# Patient Record
Sex: Male | Born: 1935 | Race: Black or African American | Hispanic: No | Marital: Single | Smoking: Former smoker
Health system: Southern US, Community
[De-identification: ages and names within clinical notes are randomized; demographics above are authoritative.]

---

## 2014-10-05 DIAGNOSIS — R000 Tachycardia, unspecified: Secondary | ICD-10-CM | POA: Diagnosis not present

## 2014-10-05 DIAGNOSIS — Z0000 Encounter for general adult medical examination without abnormal findings: Secondary | ICD-10-CM | POA: Diagnosis not present

## 2014-10-05 DIAGNOSIS — R030 Elevated blood-pressure reading, without diagnosis of hypertension: Secondary | ICD-10-CM | POA: Diagnosis not present

## 2014-10-05 DIAGNOSIS — R231 Pallor: Secondary | ICD-10-CM | POA: Diagnosis not present

## 2014-10-05 DIAGNOSIS — Z125 Encounter for screening for malignant neoplasm of prostate: Secondary | ICD-10-CM | POA: Diagnosis not present

## 2014-11-02 DIAGNOSIS — Z0000 Encounter for general adult medical examination without abnormal findings: Secondary | ICD-10-CM | POA: Diagnosis not present

## 2017-01-03 DIAGNOSIS — H60393 Other infective otitis externa, bilateral: Secondary | ICD-10-CM | POA: Diagnosis not present

## 2017-01-03 DIAGNOSIS — H6123 Impacted cerumen, bilateral: Secondary | ICD-10-CM | POA: Diagnosis not present

## 2017-01-03 DIAGNOSIS — H93293 Other abnormal auditory perceptions, bilateral: Secondary | ICD-10-CM | POA: Diagnosis not present

## 2020-04-02 ENCOUNTER — Other Ambulatory Visit: Payer: Self-pay

## 2020-04-02 ENCOUNTER — Emergency Department: Payer: Medicare Other

## 2020-04-02 DIAGNOSIS — I6782 Cerebral ischemia: Secondary | ICD-10-CM | POA: Diagnosis not present

## 2020-04-02 DIAGNOSIS — S0181XA Laceration without foreign body of other part of head, initial encounter: Secondary | ICD-10-CM | POA: Diagnosis not present

## 2020-04-02 DIAGNOSIS — Z87891 Personal history of nicotine dependence: Secondary | ICD-10-CM | POA: Diagnosis not present

## 2020-04-02 DIAGNOSIS — S0012XA Contusion of left eyelid and periocular area, initial encounter: Secondary | ICD-10-CM | POA: Diagnosis not present

## 2020-04-02 DIAGNOSIS — Y9389 Activity, other specified: Secondary | ICD-10-CM | POA: Diagnosis not present

## 2020-04-02 DIAGNOSIS — W01198A Fall on same level from slipping, tripping and stumbling with subsequent striking against other object, initial encounter: Secondary | ICD-10-CM | POA: Insufficient documentation

## 2020-04-02 DIAGNOSIS — Y999 Unspecified external cause status: Secondary | ICD-10-CM | POA: Insufficient documentation

## 2020-04-02 DIAGNOSIS — Y92009 Unspecified place in unspecified non-institutional (private) residence as the place of occurrence of the external cause: Secondary | ICD-10-CM | POA: Diagnosis not present

## 2020-04-02 DIAGNOSIS — S0993XA Unspecified injury of face, initial encounter: Secondary | ICD-10-CM | POA: Diagnosis present

## 2020-04-02 LAB — COMPREHENSIVE METABOLIC PANEL
ALT: 15 U/L (ref 0–44)
AST: 16 U/L (ref 15–41)
Albumin: 4 g/dL (ref 3.5–5.0)
Alkaline Phosphatase: 46 U/L (ref 38–126)
Anion gap: 11 (ref 5–15)
BUN: 42 mg/dL — ABNORMAL HIGH (ref 8–23)
CO2: 24 mmol/L (ref 22–32)
Calcium: 9.1 mg/dL (ref 8.9–10.3)
Chloride: 102 mmol/L (ref 98–111)
Creatinine, Ser: 2.21 mg/dL — ABNORMAL HIGH (ref 0.61–1.24)
GFR calc Af Amer: 29 mL/min — ABNORMAL LOW (ref >60)
GFR calc non Af Amer: 25 mL/min — ABNORMAL LOW (ref >60)
Glucose, Bld: 125 mg/dL — ABNORMAL HIGH (ref 70–99)
Potassium: 4.6 mmol/L (ref 3.5–5.1)
Sodium: 137 mmol/L (ref 135–145)
Total Bilirubin: 0.7 mg/dL (ref 0.3–1.2)
Total Protein: 8.4 g/dL — ABNORMAL HIGH (ref 6.5–8.1)

## 2020-04-02 LAB — CBC WITH DIFFERENTIAL/PLATELET
Abs Immature Granulocytes: 0.08 10*3/uL — ABNORMAL HIGH (ref 0.00–0.07)
Basophils Absolute: 0.1 10*3/uL (ref 0.0–0.1)
Basophils Relative: 0 %
Eosinophils Absolute: 0.2 10*3/uL (ref 0.0–0.5)
Eosinophils Relative: 2 %
HCT: 41.2 % (ref 39.0–52.0)
Hemoglobin: 13.2 g/dL (ref 13.0–17.0)
Immature Granulocytes: 1 %
Lymphocytes Relative: 9 %
Lymphs Abs: 1.2 10*3/uL (ref 0.7–4.0)
MCH: 30.1 pg (ref 26.0–34.0)
MCHC: 32 g/dL (ref 30.0–36.0)
MCV: 94.1 fL (ref 80.0–100.0)
Monocytes Absolute: 0.8 10*3/uL (ref 0.1–1.0)
Monocytes Relative: 6 %
Neutro Abs: 11.4 10*3/uL — ABNORMAL HIGH (ref 1.7–7.7)
Neutrophils Relative %: 82 %
Platelets: 231 10*3/uL (ref 150–400)
RBC: 4.38 MIL/uL (ref 4.22–5.81)
RDW: 12.8 % (ref 11.5–15.5)
WBC: 13.8 10*3/uL — ABNORMAL HIGH (ref 4.0–10.5)
nRBC: 0 % (ref 0.0–0.2)

## 2020-04-02 NOTE — ED Triage Notes (Signed)
Patient reports mechanical fall at home today. Patient HOH, daughter answering most questions for patient. Abrasion/swelling seen to left eye. Patient bilateral hand tremors, no hx of same.

## 2020-04-03 ENCOUNTER — Emergency Department
Admission: EM | Admit: 2020-04-03 | Discharge: 2020-04-03 | Disposition: A | Discharge status: Home / Self Care | Payer: Medicare Other | Attending: Emergency Medicine | Admitting: Emergency Medicine

## 2020-04-03 DIAGNOSIS — S0512XA Contusion of eyeball and orbital tissues, left eye, initial encounter: Secondary | ICD-10-CM

## 2020-04-03 DIAGNOSIS — S0181XA Laceration without foreign body of other part of head, initial encounter: Secondary | ICD-10-CM

## 2020-04-03 LAB — BASIC METABOLIC PANEL
Anion gap: 8 (ref 5–15)
BUN: 41 mg/dL — ABNORMAL HIGH (ref 8–23)
CO2: 26 mmol/L (ref 22–32)
Calcium: 8.3 mg/dL — ABNORMAL LOW (ref 8.9–10.3)
Chloride: 107 mmol/L (ref 98–111)
Creatinine, Ser: 1.97 mg/dL — ABNORMAL HIGH (ref 0.61–1.24)
GFR calc Af Amer: 33 mL/min — ABNORMAL LOW (ref >60)
GFR calc non Af Amer: 29 mL/min — ABNORMAL LOW (ref >60)
Glucose, Bld: 108 mg/dL — ABNORMAL HIGH (ref 70–99)
Potassium: 4.2 mmol/L (ref 3.5–5.1)
Sodium: 141 mmol/L (ref 135–145)

## 2020-04-03 LAB — URINALYSIS, COMPLETE (UACMP) WITH MICROSCOPIC
Bilirubin Urine: NEGATIVE
Glucose, UA: NEGATIVE mg/dL
Hgb urine dipstick: NEGATIVE
Ketones, ur: NEGATIVE mg/dL
Leukocytes,Ua: NEGATIVE
Nitrite: NEGATIVE
Protein, ur: NEGATIVE mg/dL
Specific Gravity, Urine: 1.013 (ref 1.005–1.030)
Squamous Epithelial / HPF: NONE SEEN (ref 0–5)
pH: 5 (ref 5.0–8.0)

## 2020-04-03 MED ORDER — SODIUM CHLORIDE 0.9 % IV BOLUS
1000.0000 mL | Freq: Once | INTRAVENOUS | Status: AC
  Administered 2020-04-03: 1000 mL via INTRAVENOUS

## 2020-04-03 NOTE — ED Provider Notes (Signed)
[facility] Emergency Department Provider Note  ____________________________________________   First MD Initiated Contact with Patient 04/03/20 0214     (approximate)  I have reviewed the triage vital signs and the nursing notes.   HISTORY  Chief Complaint Fall   HPI Austin Fleming is a 86 y.o. male with no known chronic medical conditions and no primary care provider presents to the emergency department following a mechanical fall at home today.  Patient's daughter states that patient stood up quickly resulting in him losing his balance striking the left side of his face.  Patient with a laceration lateral aspect superior to the left eye.  Patient denies any loss of consciousness.  Patient denies any preceding symptoms no chest pain shortness of breath.  Patient denies any preceding headache nausea or vomiting.  No weakness or numbness        History reviewed. No pertinent past medical history.  There are no problems to display for this patient.   History reviewed. No pertinent surgical history.  Prior to Admission medications   Not on File    Allergies Patient has no known allergies.  No family history on file.  Social History Social History   Tobacco Use  . Smoking status: Former Research scientist (life sciences)  . Smokeless tobacco: Former Network engineer Use Topics  . Alcohol use: Yes  . Drug use: Not on file    Review of Systems Constitutional: No fever/chills Eyes: No visual changes. ENT: No sore throat. Cardiovascular: Denies chest pain. Respiratory: Denies shortness of breath. Gastrointestinal: No abdominal pain.  No nausea, no vomiting.  No diarrhea.  No constipation. Genitourinary: Negative for dysuria. Musculoskeletal: Negative for neck pain.  Negative for back pain. Integumentary: Negative for rash.  Positive for facial laceration Neurological: Negative for headaches, focal weakness or  numbness.  ____________________________________________   PHYSICAL EXAM:  VITAL SIGNS: ED Triage Vitals  Enc Vitals Group     BP 04/02/20 2115 (!) 165/101     Pulse Rate 04/02/20 2115 88     Resp 04/02/20 2115 18     Temp 04/02/20 2115 98.4 F (36.9 C)     Temp src --      SpO2 04/02/20 2115 100 %     Weight 04/02/20 2116 72.6 kg (160 lb)     Height 04/02/20 2116 1.676 m (5\' 6" )     Head Circumference --      Peak Flow --      Pain Score --      Pain Loc --      Pain Edu? --      Excl. in [place]? --     Constitutional: Alert and oriented.  Eyes: Conjunctivae are normal.  Head: 1 cm linear laceration superior lateral aspect above the left eye.  Actively bleeding Mouth/Throat: Patient is wearing a mask. Neck: No stridor.  No meningeal signs.   Cardiovascular: Normal rate, regular rhythm. Good peripheral circulation. Grossly normal heart sounds. Respiratory: Normal respiratory effort.  No retractions. Gastrointestinal: Soft and nontender. No distention.  Musculoskeletal: No lower extremity tenderness nor edema. No gross deformities of extremities. Neurologic:  Normal speech and language. No gross focal neurologic deficits are appreciated.  Skin:  Skin is warm, dry and intact. Psychiatric: Mood and affect are normal. Speech and behavior are normal.  ____________________________________________   LABS (all labs ordered are listed, but only abnormal results are displayed)  Labs Reviewed  COMPREHENSIVE METABOLIC PANEL - Abnormal; Notable for the following components:  Result Value   Glucose, Bld 125 (*)    BUN 42 (*)    Creatinine, Ser 2.21 (*)    Total Protein 8.4 (*)    GFR calc non Af Amer 25 (*)    GFR calc Af Amer 29 (*)    All other components within normal limits  CBC WITH DIFFERENTIAL/PLATELET - Abnormal; Notable for the following components:   WBC 13.8 (*)    Neutro Abs 11.4 (*)    Abs Immature Granulocytes 0.08 (*)    All other components within normal  limits  URINALYSIS, COMPLETE (UACMP) WITH MICROSCOPIC  BASIC METABOLIC PANEL     RADIOLOGY I, [place] Danessa Mensch, personally viewed and evaluated these images (plain radiographs) as part of my medical decision making, as well as reviewing the written report by the radiologist.  ED MD interpretation: No acute findings noted on CT head and cervical spine per radiologist.  Official radiology report(s): CT Head Wo Contrast  Result Date: 04/02/2020 CLINICAL DATA:  Recent fall with facial trauma, initial encounter EXAM: CT HEAD WITHOUT CONTRAST CT MAXILLOFACIAL WITHOUT CONTRAST CT CERVICAL SPINE WITHOUT CONTRAST TECHNIQUE: Multidetector CT imaging of the head, cervical spine, and maxillofacial structures were performed using the standard protocol without intravenous contrast. Multiplanar CT image reconstructions of the cervical spine and maxillofacial structures were also generated. COMPARISON:  None. FINDINGS: CT HEAD FINDINGS Brain: Mild atrophic changes and chronic white matter ischemic changes are seen. No findings to suggest acute hemorrhage, acute infarction or space-occupying mass lesion are noted. Vascular: No hyperdense vessel or unexpected calcification. Skull: Normal. Negative for fracture or focal lesion. Other: Soft tissue swelling is noted in the left supraorbital region CT MAXILLOFACIAL FINDINGS Osseous: No acute fracture or dislocation is noted. There are periapical lucencies noted in mandible in the residual incisors as well as significant dental caries in the remaining teeth. No bony erosive changes are seen. Orbits: Orbits and their contents are within normal limits. Sinuses: Paranasal sinuses are well aerated. A few small mucosal retention cysts are seen in the maxillary antra on the right. Soft tissues: Considerable soft tissue swelling is noted along the left orbit laterally and superiorly consistent with the recent injury. Some subcutaneous air is noted likely related to underlying  laceration. No focal hematoma is seen. CT CERVICAL SPINE FINDINGS Alignment: Within normal limits. Skull base and vertebrae: 7 cervical segments are well visualized. Vertebral body height is well maintained. Disc space narrowing is noted from C4-C7 with associated osteophytic changes. Bilateral facet hypertrophic changes noted. No acute fracture or acute facet abnormality is seen. Soft tissues and spinal canal: Surrounding soft tissue structures show atherosclerotic calcifications of carotid arteries bilaterally. No acute soft tissue abnormality is seen. Upper chest: Visualized lung apices are within normal limits. Other: None IMPRESSION: CT of the head: Chronic atrophic and ischemic changes without acute abnormality. CT of the maxillofacial bones: Left periorbital soft tissue swelling although no bony abnormality is seen. Dental caries and periapical lucencies within the mandible. Periapical abscesses could not be excluded. CT of the cervical spine: Multilevel degenerative change without acute abnormality. Electronically Signed   By: Inez Catalina M.D.   On: 04/02/2020 21:56   CT Cervical Spine Wo Contrast  Result Date: 04/02/2020 CLINICAL DATA:  Recent fall with facial trauma, initial encounter EXAM: CT HEAD WITHOUT CONTRAST CT MAXILLOFACIAL WITHOUT CONTRAST CT CERVICAL SPINE WITHOUT CONTRAST TECHNIQUE: Multidetector CT imaging of the head, cervical spine, and maxillofacial structures were performed using the standard protocol without intravenous contrast. Multiplanar CT  image reconstructions of the cervical spine and maxillofacial structures were also generated. COMPARISON:  None. FINDINGS: CT HEAD FINDINGS Brain: Mild atrophic changes and chronic white matter ischemic changes are seen. No findings to suggest acute hemorrhage, acute infarction or space-occupying mass lesion are noted. Vascular: No hyperdense vessel or unexpected calcification. Skull: Normal. Negative for fracture or focal lesion. Other: Soft  tissue swelling is noted in the left supraorbital region CT MAXILLOFACIAL FINDINGS Osseous: No acute fracture or dislocation is noted. There are periapical lucencies noted in mandible in the residual incisors as well as significant dental caries in the remaining teeth. No bony erosive changes are seen. Orbits: Orbits and their contents are within normal limits. Sinuses: Paranasal sinuses are well aerated. A few small mucosal retention cysts are seen in the maxillary antra on the right. Soft tissues: Considerable soft tissue swelling is noted along the left orbit laterally and superiorly consistent with the recent injury. Some subcutaneous air is noted likely related to underlying laceration. No focal hematoma is seen. CT CERVICAL SPINE FINDINGS Alignment: Within normal limits. Skull base and vertebrae: 7 cervical segments are well visualized. Vertebral body height is well maintained. Disc space narrowing is noted from C4-C7 with associated osteophytic changes. Bilateral facet hypertrophic changes noted. No acute fracture or acute facet abnormality is seen. Soft tissues and spinal canal: Surrounding soft tissue structures show atherosclerotic calcifications of carotid arteries bilaterally. No acute soft tissue abnormality is seen. Upper chest: Visualized lung apices are within normal limits. Other: None IMPRESSION: CT of the head: Chronic atrophic and ischemic changes without acute abnormality. CT of the maxillofacial bones: Left periorbital soft tissue swelling although no bony abnormality is seen. Dental caries and periapical lucencies within the mandible. Periapical abscesses could not be excluded. CT of the cervical spine: Multilevel degenerative change without acute abnormality. Electronically Signed   By: Inez Catalina M.D.   On: 04/02/2020 21:56   CT Maxillofacial Wo Contrast  Result Date: 04/02/2020 CLINICAL DATA:  Recent fall with facial trauma, initial encounter EXAM: CT HEAD WITHOUT CONTRAST CT  MAXILLOFACIAL WITHOUT CONTRAST CT CERVICAL SPINE WITHOUT CONTRAST TECHNIQUE: Multidetector CT imaging of the head, cervical spine, and maxillofacial structures were performed using the standard protocol without intravenous contrast. Multiplanar CT image reconstructions of the cervical spine and maxillofacial structures were also generated. COMPARISON:  None. FINDINGS: CT HEAD FINDINGS Brain: Mild atrophic changes and chronic white matter ischemic changes are seen. No findings to suggest acute hemorrhage, acute infarction or space-occupying mass lesion are noted. Vascular: No hyperdense vessel or unexpected calcification. Skull: Normal. Negative for fracture or focal lesion. Other: Soft tissue swelling is noted in the left supraorbital region CT MAXILLOFACIAL FINDINGS Osseous: No acute fracture or dislocation is noted. There are periapical lucencies noted in mandible in the residual incisors as well as significant dental caries in the remaining teeth. No bony erosive changes are seen. Orbits: Orbits and their contents are within normal limits. Sinuses: Paranasal sinuses are well aerated. A few small mucosal retention cysts are seen in the maxillary antra on the right. Soft tissues: Considerable soft tissue swelling is noted along the left orbit laterally and superiorly consistent with the recent injury. Some subcutaneous air is noted likely related to underlying laceration. No focal hematoma is seen. CT CERVICAL SPINE FINDINGS Alignment: Within normal limits. Skull base and vertebrae: 7 cervical segments are well visualized. Vertebral body height is well maintained. Disc space narrowing is noted from C4-C7 with associated osteophytic changes. Bilateral facet hypertrophic changes noted. No acute  fracture or acute facet abnormality is seen. Soft tissues and spinal canal: Surrounding soft tissue structures show atherosclerotic calcifications of carotid arteries bilaterally. No acute soft tissue abnormality is seen.  Upper chest: Visualized lung apices are within normal limits. Other: None IMPRESSION: CT of the head: Chronic atrophic and ischemic changes without acute abnormality. CT of the maxillofacial bones: Left periorbital soft tissue swelling although no bony abnormality is seen. Dental caries and periapical lucencies within the mandible. Periapical abscesses could not be excluded. CT of the cervical spine: Multilevel degenerative change without acute abnormality. Electronically Signed   By: Inez Catalina M.D.   On: 04/02/2020 21:56     Procedures   ____________________________________________   INITIAL IMPRESSION / MDM / ASSESSMENT AND PLAN / ED COURSE  As part of my medical decision making, I reviewed the following data within the [place] NUMBER  86 year old male presented with above-stated history and physical exam differential diagnosis including but not limited to facial laceration periorbital contusion, intracranial hemorrhage facial bone fracture cervical injury.  CT head maxillofacial and cervical spine revealed no acute findings.  Laboratory data notable for creatinine of 2.21 with a BUN of 42.  Patient given 1 L IV normal saline with improvement of creatinine to 1.97 BUN of 41.  Patient receiving an additional liter of fluid now.  Patient's daughter states that he does poorly drinking water.  I spoke with the patient's daughter regarding the need to follow-up with primary care provider if to which should be referred.  She states that her father only goes to the doctor when he has a problem and does not have a primary care provider.  ____________________________________________  FINAL CLINICAL IMPRESSION(S) / ED DIAGNOSES  Final diagnoses:  Periorbital contusion of left eye, initial encounter  Facial laceration, initial encounter     MEDICATIONS GIVEN DURING THIS VISIT:  Medications  sodium chloride 0.9 % bolus 1,000 mL (0 mLs Intravenous Stopped 04/03/20 0333)  sodium  chloride 0.9 % bolus 1,000 mL (1,000 mLs Intravenous New Bag/Given 04/03/20 0347)     ED Discharge Orders    None      *Please note:  Austin Fleming was evaluated in Emergency Department on 04/03/2020 for the symptoms described in the history of present illness. He was evaluated in the context of the global COVID-19 pandemic, which necessitated consideration that the patient might be at risk for infection with the SARS-CoV-2 virus that causes COVID-19. Institutional protocols and algorithms that pertain to the evaluation of patients at risk for COVID-19 are in a state of rapid change based on information released by regulatory bodies including the CDC and federal and state organizations. These policies and algorithms were followed during the patient's care in the ED.  Some ED evaluations and interventions may be delayed as a result of limited staffing during and after the pandemic.*  Note:  This document was prepared using Dragon voice recognition software and may include unintentional dictation errors.   Gregor Hams, MD 04/03/20 [phone]

## 2020-04-03 NOTE — ED Notes (Signed)
Reviewed discharge instructions, follow-up care, and laceration care with patient. Patient verbalized understanding of all information reviewed. Patient stable, with no distress noted at this time.

## 2021-02-19 ENCOUNTER — Emergency Department: Payer: Medicare Other

## 2021-02-19 ENCOUNTER — Inpatient Hospital Stay
Admission: EM | Admit: 2021-02-19 | Discharge: 2021-02-23 | DRG: 065 | Disposition: A | Discharge status: Skilled Nursing Facility | Payer: Medicare Other | Attending: Internal Medicine | Admitting: Internal Medicine

## 2021-02-19 ENCOUNTER — Other Ambulatory Visit: Payer: Self-pay

## 2021-02-19 ENCOUNTER — Observation Stay: Payer: Medicare Other

## 2021-02-19 DIAGNOSIS — R55 Syncope and collapse: Secondary | ICD-10-CM | POA: Diagnosis not present

## 2021-02-19 DIAGNOSIS — I441 Atrioventricular block, second degree: Secondary | ICD-10-CM | POA: Diagnosis present

## 2021-02-19 DIAGNOSIS — Z20822 Contact with and (suspected) exposure to covid-19: Secondary | ICD-10-CM | POA: Diagnosis present

## 2021-02-19 DIAGNOSIS — H9190 Unspecified hearing loss, unspecified ear: Secondary | ICD-10-CM | POA: Diagnosis present

## 2021-02-19 DIAGNOSIS — Z7982 Long term (current) use of aspirin: Secondary | ICD-10-CM

## 2021-02-19 DIAGNOSIS — R4182 Altered mental status, unspecified: Secondary | ICD-10-CM | POA: Diagnosis present

## 2021-02-19 DIAGNOSIS — I639 Cerebral infarction, unspecified: Secondary | ICD-10-CM

## 2021-02-19 DIAGNOSIS — R54 Age-related physical debility: Secondary | ICD-10-CM | POA: Diagnosis present

## 2021-02-19 DIAGNOSIS — E860 Dehydration: Secondary | ICD-10-CM | POA: Diagnosis not present

## 2021-02-19 DIAGNOSIS — Z79899 Other long term (current) drug therapy: Secondary | ICD-10-CM

## 2021-02-19 DIAGNOSIS — N184 Chronic kidney disease, stage 4 (severe): Secondary | ICD-10-CM | POA: Diagnosis present

## 2021-02-19 DIAGNOSIS — R29702 NIHSS score 2: Secondary | ICD-10-CM | POA: Diagnosis present

## 2021-02-19 DIAGNOSIS — E785 Hyperlipidemia, unspecified: Secondary | ICD-10-CM | POA: Diagnosis present

## 2021-02-19 DIAGNOSIS — Z87891 Personal history of nicotine dependence: Secondary | ICD-10-CM

## 2021-02-19 DIAGNOSIS — I6322 Cerebral infarction due to unspecified occlusion or stenosis of basilar arteries: Principal | ICD-10-CM | POA: Diagnosis present

## 2021-02-19 DIAGNOSIS — R410 Disorientation, unspecified: Secondary | ICD-10-CM

## 2021-02-19 DIAGNOSIS — R4189 Other symptoms and signs involving cognitive functions and awareness: Secondary | ICD-10-CM | POA: Diagnosis present

## 2021-02-19 DIAGNOSIS — F419 Anxiety disorder, unspecified: Secondary | ICD-10-CM | POA: Diagnosis present

## 2021-02-19 LAB — BASIC METABOLIC PANEL
Anion gap: 7 (ref 5–15)
BUN: 51 mg/dL — ABNORMAL HIGH (ref 8–23)
CO2: 22 mmol/L (ref 22–32)
Calcium: 8.9 mg/dL (ref 8.9–10.3)
Chloride: 109 mmol/L (ref 98–111)
Creatinine, Ser: 2.28 mg/dL — ABNORMAL HIGH (ref 0.61–1.24)
GFR, Estimated: 26 mL/min — ABNORMAL LOW (ref >60)
Glucose, Bld: 130 mg/dL — ABNORMAL HIGH (ref 70–99)
Potassium: 4 mmol/L (ref 3.5–5.1)
Sodium: 138 mmol/L (ref 135–145)

## 2021-02-19 LAB — CBC
HCT: 39 % (ref 39.0–52.0)
Hemoglobin: 12.7 g/dL — ABNORMAL LOW (ref 13.0–17.0)
MCH: 29.9 pg (ref 26.0–34.0)
MCHC: 32.6 g/dL (ref 30.0–36.0)
MCV: 91.8 fL (ref 80.0–100.0)
Platelets: 247 10*3/uL (ref 150–400)
RBC: 4.25 MIL/uL (ref 4.22–5.81)
RDW: 12.9 % (ref 11.5–15.5)
WBC: 11.2 10*3/uL — ABNORMAL HIGH (ref 4.0–10.5)
nRBC: 0 % (ref 0.0–0.2)

## 2021-02-19 LAB — URINALYSIS, COMPLETE (UACMP) WITH MICROSCOPIC
Bilirubin Urine: NEGATIVE
Glucose, UA: NEGATIVE mg/dL
Ketones, ur: NEGATIVE mg/dL
Leukocytes,Ua: NEGATIVE
Nitrite: NEGATIVE
Protein, ur: NEGATIVE mg/dL
Specific Gravity, Urine: 1.013 (ref 1.005–1.030)
Squamous Epithelial / HPF: NONE SEEN (ref 0–5)
pH: 5 (ref 5.0–8.0)

## 2021-02-19 LAB — RESP PANEL BY RT-PCR (FLU A&B, COVID) ARPGX2
Influenza A by PCR: NEGATIVE
Influenza B by PCR: NEGATIVE
SARS Coronavirus 2 by RT PCR: NEGATIVE

## 2021-02-19 LAB — GLUCOSE, CAPILLARY: Glucose-Capillary: 108 mg/dL — ABNORMAL HIGH (ref 70–99)

## 2021-02-19 LAB — TROPONIN I (HIGH SENSITIVITY)
Troponin I (High Sensitivity): 12 ng/L (ref <18)
Troponin I (High Sensitivity): 12 ng/L (ref <18)

## 2021-02-19 MED ORDER — SODIUM CHLORIDE 0.9% FLUSH
3.0000 mL | Freq: Two times a day (BID) | INTRAVENOUS | Status: DC
  Administered 2021-02-19 – 2021-02-23 (×9): 3 mL via INTRAVENOUS

## 2021-02-19 MED ORDER — STROKE: EARLY STAGES OF RECOVERY BOOK: Freq: Once | Status: DC

## 2021-02-19 MED ORDER — LACTATED RINGERS IV BOLUS
1000.0000 mL | Freq: Once | INTRAVENOUS | Status: AC
  Administered 2021-02-19: 10:00:00 1000 mL via INTRAVENOUS

## 2021-02-19 MED ORDER — ENOXAPARIN SODIUM 30 MG/0.3ML IJ SOSY: 30.0000 mg | PREFILLED_SYRINGE | INTRAMUSCULAR | Status: DC

## 2021-02-19 MED ORDER — ONDANSETRON HCL 4 MG/2ML IJ SOLN
4.0000 mg | Freq: Four times a day (QID) | INTRAMUSCULAR | Status: DC | PRN
  Filled 2021-02-19: qty 2

## 2021-02-19 MED ORDER — ONDANSETRON HCL 4 MG PO TABS: 4.0000 mg | ORAL_TABLET | Freq: Four times a day (QID) | ORAL | Status: DC | PRN

## 2021-02-19 MED ORDER — ORAL CARE MOUTH RINSE
15.0000 mL | Freq: Two times a day (BID) | OROMUCOSAL | Status: DC
  Administered 2021-02-19 – 2021-02-23 (×8): 15 mL via OROMUCOSAL
  Filled 2021-02-19 (×2): qty 15

## 2021-02-19 MED ORDER — SODIUM CHLORIDE 0.9 % IV SOLN: INTRAVENOUS | Status: DC

## 2021-02-19 MED ORDER — POLYETHYLENE GLYCOL 3350 17 G PO PACK
17.0000 g | PACK | Freq: Once | ORAL | Status: AC
  Administered 2021-02-19: 17 g via ORAL
  Filled 2021-02-19: qty 1

## 2021-02-19 MED ORDER — VITAMIN D3 25 MCG (1000 UNIT) PO TABS
1000.0000 [IU] | ORAL_TABLET | Freq: Every day | ORAL | Status: DC
  Administered 2021-02-20 – 2021-02-23 (×4): 1000 [IU] via ORAL
  Filled 2021-02-19 (×7): qty 1

## 2021-02-19 MED ORDER — ASPIRIN 325 MG PO TABS
325.0000 mg | ORAL_TABLET | Freq: Every day | ORAL | Status: DC
  Administered 2021-02-19: 325 mg via ORAL
  Filled 2021-02-19: qty 1

## 2021-02-19 MED ORDER — ACETAMINOPHEN 500 MG PO TABS: 500.0000 mg | ORAL_TABLET | Freq: Four times a day (QID) | ORAL | Status: DC | PRN

## 2021-02-19 MED ORDER — VITAMIN B-12 1000 MCG PO TABS
1000.0000 ug | ORAL_TABLET | Freq: Every day | ORAL | Status: DC
  Administered 2021-02-20 – 2021-02-22 (×3): 1000 ug via ORAL
  Filled 2021-02-19 (×3): qty 1

## 2021-02-19 MED ORDER — ATORVASTATIN CALCIUM 20 MG PO TABS
80.0000 mg | ORAL_TABLET | Freq: Every day | ORAL | Status: DC
  Administered 2021-02-19 – 2021-02-20 (×2): 80 mg via ORAL
  Filled 2021-02-19 (×2): qty 4

## 2021-02-19 NOTE — Progress Notes (Addendum)
Patient admitted to the hospital after he fell at home for ??  Syncopal episode to rule out an acute stroke. 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  Additional punctate infarct in the high right frontoparietal perirolandic white matter.  Mild associated edema without mass-effect.  Given involvement of different vascular territories, consider embolic etiology. 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 Patient is outside the window for TPA I initially ordered aspirin 325 and high intensity statins but discussed with neurology who recommends to hold aspirin for now due to the findings of supratentorial microhemorrhages. Called and discussed MRI findings with patient's daughter and HPOA, Alvie Heidelberg. All questions and concerns have been addressed.  She verbalizes understanding and agrees with the plan.

## 2021-02-19 NOTE — ED Provider Notes (Signed)
[facility] Emergency Department Provider Note  ____________________________________________   Event Date/Time   First MD Initiated Contact with Patient 02/19/21 [id number]     (approximate)  I have reviewed the triage vital signs and the nursing notes.   HISTORY  Chief Complaint Loss of Consciousness and Altered Mental Status    HPI Austin Fleming is a 87 y.o. male here with increasing confusion.  Patient lives with family.  He reportedly was found at the bottom of the foot of his bed, and was very confused.  Patient does state that he feels somewhat weak but denies any other complaints.  Specifically, denies any headache, arm pain, hip pain after the fall.  Per family, patient has not been eating and drinking much, and was actually brought to his physician last week for weakness, and told to drink more fluid.  He has essentially not had anything to eat or drink for the last several days.  He said no nausea or vomiting.  On my assessment, patient denies any complaints but history is limited due to confusion.  Level 5 caveat invoked as remainder of history, ROS, and physical exam limited due to patient's confusion.     History reviewed. No pertinent past medical history.  There are no problems to display for this patient.   History reviewed. No pertinent surgical history.  Prior to Admission medications   Medication Sig Start Date End Date Taking? Authorizing Provider  acetaminophen (TYLENOL) 500 MG tablet Take 500 mg by mouth every 6 (six) hours as needed.   Yes [provider]  aspirin 325 MG tablet Take 325 mg by mouth daily.   Yes [provider]  Cholecalciferol 25 MCG (1000 UT) capsule Take 1,000 Units by mouth daily.   Yes [provider]  cyanocobalamin 1000 MCG tablet Take 1,000 mcg by mouth daily.   Yes [provider]  Misc Natural Products (OSTEO BI-FLEX ADV TRIPLE ST PO) Take 1 tablet by mouth daily.   Yes  [provider]    Allergies Patient has no known allergies.  No family history on file.  Social History Social History   Tobacco Use   Smoking status: Former    Pack years: 0.00   Smokeless tobacco: Former  Substance Use Topics   Alcohol use: Yes    Review of Systems  Review of Systems  Constitutional:  Positive for fatigue. Negative for chills and fever.  HENT:  Negative for sore throat.   Respiratory:  Negative for shortness of breath.   Cardiovascular:  Negative for chest pain.  Gastrointestinal:  Negative for abdominal pain.  Genitourinary:  Negative for flank pain.  Musculoskeletal:  Negative for neck pain.  Skin:  Negative for rash and wound.  Allergic/Immunologic: Negative for immunocompromised state.  Neurological:  Positive for weakness. Negative for numbness.  Hematological:  Does not bruise/bleed easily.  Psychiatric/Behavioral:  Positive for confusion.   All other systems reviewed and are negative.   ____________________________________________  PHYSICAL EXAM:      VITAL SIGNS: ED Triage Vitals  Enc Vitals Group     BP 02/19/21 0854 (!) 160/99     Pulse Rate 02/19/21 0854 83     Resp 02/19/21 0854 19     Temp 02/19/21 0854 97.9 F (36.6 C)     Temp Source 02/19/21 0854 Oral     SpO2 02/19/21 0854 99 %     Weight 02/19/21 0843 151 lb 11.2 oz (68.8 kg)  Height 02/19/21 0843 '5\' 6"'$  (1.676 m)     Head Circumference --      Peak Flow --      Pain Score 02/19/21 0843 0     Pain Loc --      Pain Edu? --      Excl. in [place]? --      Physical Exam Vitals and nursing note reviewed.  Constitutional:      General: He is not in acute distress.    Appearance: He is well-developed.  HENT:     Head: Normocephalic and atraumatic.     Mouth/Throat:     Mouth: Mucous membranes are dry.  Eyes:     Conjunctiva/sclera: Conjunctivae normal.  Cardiovascular:     Rate and Rhythm: Normal rate and regular rhythm.     Heart sounds: Normal heart  sounds.  Pulmonary:     Effort: Pulmonary effort is normal. No respiratory distress.     Breath sounds: No wheezing.  Abdominal:     General: There is no distension.  Musculoskeletal:     Cervical back: Neck supple.  Skin:    General: Skin is warm.     Capillary Refill: Capillary refill takes less than 2 seconds.     Findings: No rash.  Neurological:     Mental Status: He is alert.     Motor: No abnormal muscle tone.     Comments: Oriented to person, knows he is in [place] but not the city.  He is able to state he is in a hospital.  Unsure of the date or year.  Moves all extremities with 5 and 5 strength.  Normal sensation to light touch.  Cranial nerves grossly intact.      ____________________________________________   LABS (all labs ordered are listed, but only abnormal results are displayed)  Labs Reviewed  BASIC METABOLIC PANEL - Abnormal; Notable for the following components:      Result Value   Glucose, Bld 130 (*)    BUN 51 (*)    Creatinine, Ser 2.28 (*)    GFR, Estimated 26 (*)    All other components within normal limits  CBC - Abnormal; Notable for the following components:   WBC 11.2 (*)    Hemoglobin 12.7 (*)    All other components within normal limits  URINALYSIS, COMPLETE (UACMP) WITH MICROSCOPIC - Abnormal; Notable for the following components:   Color, Urine YELLOW (*)    APPearance CLEAR (*)    Hgb urine dipstick SMALL (*)    Bacteria, UA RARE (*)    All other components within normal limits  RESP PANEL BY RT-PCR (FLU A&B, COVID) [id number]  CBG MONITORING, ED  TROPONIN I (HIGH SENSITIVITY)  TROPONIN I (HIGH SENSITIVITY)    ____________________________________________  EKG: Normal sinus rhythm, ventricular rate 89.  PR 265, QRS 95, QTc 446.  LVH with secondary repull.  No acute ST elevations or depressions. ________________________________________  RADIOLOGY All imaging, including plain films, CT scans, and ultrasounds, independently  reviewed by me, and interpretations confirmed via formal radiology reads.  ED MD interpretation:   Chest x-ray: No active disease CT head: No acute intracranial abnormality  Official radiology report(s): DG Chest 2 View  Result Date: 02/19/2021 CLINICAL DATA:  Weakness and confusion EXAM: CHEST - 2 VIEW COMPARISON:  None. FINDINGS: Normal heart size. Aortic tortuosity. There is no edema, consolidation, effusion, or pneumothorax. Artifact from EKG leads IMPRESSION: No evidence of active disease. Electronically Signed   By: Angelica Chessman  Watts M.D.   On: 02/19/2021 09:24   CT HEAD WO CONTRAST  Result Date: 02/19/2021 CLINICAL DATA:  Head trauma, minor. EXAM: CT HEAD WITHOUT CONTRAST TECHNIQUE: Contiguous axial images were obtained from the base of the skull through the vertex without intravenous contrast. COMPARISON:  CT head/maxillofacial 03/03/2020. FINDINGS: Brain: Mild-to-moderate cerebral atrophy. Advanced patchy and ill-defined hypoattenuation within the cerebral white matter, nonspecific but compatible with chronic small vessel ischemic disease. There is no acute intracranial hemorrhage. No demarcated cortical infarct. No extra-axial fluid collection. No evidence of intracranial mass. No midline shift. Vascular: No hyperdense vessel.  Atherosclerotic calcifications. Skull: Normal. Negative for fracture or focal lesion. Sinuses/Orbits: Visualized orbits show no acute finding. Redemonstrated complete opacification of the right frontal sinus. Scattered partial opacification of the bilateral ethmoid air cells. Frothy secretions within the left sphenoid sinus. Mild mucosal thickening and mucous retention cysts within the partially imaged right maxillary sinus. IMPRESSION: No evidence of acute intracranial abnormality. Mild-to-moderate cerebral atrophy and advanced cerebral white matter chronic small vessel ischemic disease. Paranasal sinus disease, as described. Electronically Signed   By: Kellie Simmering DO    On: 02/19/2021 10:01    ____________________________________________  PROCEDURES   Procedure(s) performed (including Critical Care):  Procedures  ____________________________________________  INITIAL IMPRESSION / MDM / [facility] / ED COURSE  As part of my medical decision making, I reviewed the following data within the [facility] notes reviewed and incorporated, Old chart reviewed, Notes from prior ED visits, and [place] Controlled Substance Database       *GLADWIN HARDCASTLE was evaluated in Emergency Department on 02/19/2021 for the symptoms described in the history of present illness. He was evaluated in the context of the global COVID-19 pandemic, which necessitated consideration that the patient might be at risk for infection with the SARS-CoV-2 virus that causes COVID-19. Institutional protocols and algorithms that pertain to the evaluation of patients at risk for COVID-19 are in a state of rapid change based on information released by regulatory bodies including the CDC and federal and state organizations. These policies and algorithms were followed during the patient's care in the ED.  Some ED evaluations and interventions may be delayed as a result of limited staffing during the pandemic.*     Medical Decision Making: 87 year old male here with generalized weakness and confusion.  Suspect delirium in the setting of decreased p.o. intake and dehydration, with underlying likely chronic microvascular changes of the brain.  He has no apparent focal neurological deficits.  Lab work does show increasing BUN to creatinine compared to baseline, and he reportedly has not wanted to eat or drink for 2 days.  Otherwise, no apparent infection.  Chest x-ray reviewed and is clear.  CT head without acute changes.  Urinalysis without signs of UTI.  Patient remains consistently confused off of his baseline per family.  Will admit for IV fluid and rehydration, further work-up  as indicated.  ____________________________________________  FINAL CLINICAL IMPRESSION(S) / ED DIAGNOSES  Final diagnoses:  Dehydration  Delirium     MEDICATIONS GIVEN DURING THIS VISIT:  Medications  lactated ringers bolus 1,000 mL (1,000 mLs Intravenous New Bag/Given 02/19/21 1009)     ED Discharge Orders     None        Note:  This document was prepared using Dragon voice recognition software and may include unintentional dictation errors.   Duffy Bruce, MD 02/19/21 1122

## 2021-02-19 NOTE — Progress Notes (Signed)
Daughter set the password and was informed that no one will be given any information unless they provide the password

## 2021-02-19 NOTE — H&P (Signed)
History and Physical    Austin Fleming [id number] DOB: [birth year] DOA: 02/19/2021  PCP: Leonel Ramsay, MD   Patient coming from: Home  I have personally briefly reviewed patient's old medical records in [place]  Chief Complaint: Change in mental status Most of the history was obtained from patient's daughter who was at the bedside, patient does not remember what happened this morning.  HPI: Austin Fleming is a 87 y.o. male with no significant past medical history who presents to the emergency room via EMS for evaluation of possible syncopal episode. Patient's daughter states that her father lives with her brother who had seen the patient earlier that morning sitting at the edge of the bed.  He subsequently had a loud noise and went to check on the patient and found him on the floor.  She is unable to tell me if he lost consciousness and the patient does not remember what happened.  According to her brother he was very diaphoretic and complained of nausea.  Patient denies having any pain anywhere or any headache. Family states that he has had poor oral intake for the last couple of days but he denies having any nausea, no vomiting, no diarrhea, no fever or chills. When he arrived to ER he was noted to be confused and was repeating phrases.  At the time of my evaluation, he is awake, alert and oriented to person place and time which is his baseline. He denies having any chest pain, no shortness of breath, no palpitations, no diaphoresis, no urinary symptoms, no changes in his bowel habits, no focal deficits, no blurred vision. Labs show sodium 138, potassium 4.0, chloride 109, bicarb 22, glucose 130, BUN 51 compared to baseline of 41, creatinine 2.28 compared to baseline of 1.97, calcium 8.9, troponin 12, white count 11.2, hemoglobin 12.7, hematocrit 39, MCV 91.8, RDW 12.9, platelet count 247 Respiratory viral panel is negative Chest X-ray reviewed by me shows no evidence of  cardiopulmonary disease CT scan of the head without contrast shows no evidence of acute intracranial abnormality. Mild-to-moderate cerebral atrophy and advanced cerebral white matter chronic small vessel ischemic disease.Paranasal sinus disease, as described. Twelve-lead EKG reviewed by me shows sinus rhythm with sinus pauses  ED Course: Patient is a 87 year old who presents to the ER by EMS for evaluation of a ??  Syncopal episode.  Patient fell according to his son and was diaphoretic and pale.  When he arrived to the ED he was said to have been confused and repeating phrases. At the time of my evaluation he is back to his baseline is awake, alert and oriented to person place and time. Twelve-lead EKG shows sinus pauses. Patient will be referred to observation status for further evaluation.     Review of Systems: As per HPI otherwise all other systems reviewed and negative.    History reviewed. No pertinent past medical history.  History reviewed. No pertinent surgical history.   reports that he has quit smoking. He has quit using smokeless tobacco. He reports current alcohol use. No history on file for drug use.  No Known Allergies  No family history on file.  Family history was reviewed with patient and he states that he has no past family history.  We are getting paid for little 3   Prior to Admission medications   Medication Sig Start Date End Date Taking? Authorizing Provider  acetaminophen (TYLENOL) 500 MG tablet Take 500 mg by mouth every 6 (six)  hours as needed.   Yes [provider]  aspirin 325 MG tablet Take 325 mg by mouth daily.   Yes [provider]  Cholecalciferol 25 MCG (1000 UT) capsule Take 1,000 Units by mouth daily.   Yes [provider]  cyanocobalamin 1000 MCG tablet Take 1,000 mcg by mouth daily.   Yes [provider]  Misc Natural Products (OSTEO BI-FLEX ADV TRIPLE ST PO) Take 1 tablet by mouth daily.   Yes [provider]    Physical Exam: Vitals:   02/19/21 0930 02/19/21 1030 02/19/21 1200 02/19/21 1316  BP:  (!) 148/59 (!) 149/96 (!) 164/97  Pulse: 90 94 92 100  Resp:  16 (!) 21 18  Temp:    98 F (36.7 C)  TempSrc:    Oral  SpO2: 97% 98% 97% 100%  Weight:      Height:         Vitals:   02/19/21 0930 02/19/21 1030 02/19/21 1200 02/19/21 1316  BP:  (!) 148/59 (!) 149/96 (!) 164/97  Pulse: 90 94 92 100  Resp:  16 (!) 21 18  Temp:    98 F (36.7 C)  TempSrc:    Oral  SpO2: 97% 98% 97% 100%  Weight:      Height:          Constitutional: Alert and oriented x 3 . Not in any apparent distress. Thin and frail HEENT:      Head: Normocephalic and atraumatic.         Eyes: PERLA, EOMI, Conjunctivae are normal. Sclera is non-icteric.       Mouth/Throat: Mucous membranes are moist.       Neck: Supple with no signs of meningismus. Cardiovascular: Regular rate and rhythm. No murmurs, gallops, or rubs. 2+ symmetrical distal pulses are present . No JVD. No LE edema Respiratory: Respiratory effort normal .Lungs sounds clear bilaterally. No wheezes, crackles, or rhonchi.  Gastrointestinal: Soft, non tender, and non distended with positive bowel sounds.  Genitourinary: No CVA tenderness. Musculoskeletal: Nontender with normal range of motion in all extremities. No cyanosis, or erythema of extremities. Neurologic:  Face is symmetric. Moving all extremities. No gross focal neurologic deficits . Skin: Skin is warm, dry.  No rash or ulcers Psychiatric: Mood and affect are normal  Labs on Admission: I have personally reviewed following labs and imaging studies  CBC: Recent Labs  Lab 02/19/21 0853  WBC 11.2*  HGB 12.7*  HCT 39.0  MCV 91.8  PLT [id number]   Basic Metabolic Panel: Recent Labs  Lab 02/19/21 0853  NA 138  K 4.0  CL 109  CO2 22  GLUCOSE 130*  BUN 51*  CREATININE 2.28*  CALCIUM 8.9   GFR: Estimated Creatinine Clearance: 18.7 mL/min (A) (by C-G formula based on SCr of  2.28 mg/dL (H)). Liver Function Tests: No results for input(s): AST, ALT, ALKPHOS, BILITOT, PROT, ALBUMIN in the last 168 hours. No results for input(s): LIPASE, AMYLASE in the last 168 hours. No results for input(s): AMMONIA in the last 168 hours. Coagulation Profile: No results for input(s): INR, PROTIME in the last 168 hours. Cardiac Enzymes: No results for input(s): CKTOTAL, CKMB, CKMBINDEX, TROPONINI in the last 168 hours. BNP (last 3 results) No results for input(s): PROBNP in the last 8760 hours. HbA1C: No results for input(s): HGBA1C in the last 72 hours. CBG: No results for input(s): GLUCAP in the last 168 hours. Lipid Profile: No results for input(s): CHOL, HDL, LDLCALC, TRIG, CHOLHDL,  LDLDIRECT in the last 72 hours. Thyroid Function Tests: No results for input(s): TSH, T4TOTAL, FREET4, T3FREE, THYROIDAB in the last 72 hours. Anemia Panel: No results for input(s): VITAMINB12, FOLATE, FERRITIN, TIBC, IRON, RETICCTPCT in the last 72 hours. Urine analysis:    Component Value Date/Time   COLORURINE YELLOW (A) 02/19/2021 1055   APPEARANCEUR CLEAR (A) 02/19/2021 1055   LABSPEC 1.013 02/19/2021 1055   PHURINE 5.0 02/19/2021 1055   GLUCOSEU NEGATIVE 02/19/2021 1055   HGBUR SMALL (A) 02/19/2021 1055   BILIRUBINUR NEGATIVE 02/19/2021 1055   KETONESUR NEGATIVE 02/19/2021 1055   PROTEINUR NEGATIVE 02/19/2021 1055   NITRITE NEGATIVE 02/19/2021 1055   LEUKOCYTESUR NEGATIVE 02/19/2021 1055    Radiological Exams on Admission: DG Chest 2 View  Result Date: 02/19/2021 CLINICAL DATA:  Weakness and confusion EXAM: CHEST - 2 VIEW COMPARISON:  None. FINDINGS: Normal heart size. Aortic tortuosity. There is no edema, consolidation, effusion, or pneumothorax. Artifact from EKG leads IMPRESSION: No evidence of active disease. Electronically Signed   By: Monte Fantasia M.D.   On: 02/19/2021 09:24   CT HEAD WO CONTRAST  Result Date: 02/19/2021 CLINICAL DATA:  Head trauma, minor. EXAM: CT  HEAD WITHOUT CONTRAST TECHNIQUE: Contiguous axial images were obtained from the base of the skull through the vertex without intravenous contrast. COMPARISON:  CT head/maxillofacial 03/03/2020. FINDINGS: Brain: Mild-to-moderate cerebral atrophy. Advanced patchy and ill-defined hypoattenuation within the cerebral white matter, nonspecific but compatible with chronic small vessel ischemic disease. There is no acute intracranial hemorrhage. No demarcated cortical infarct. No extra-axial fluid collection. No evidence of intracranial mass. No midline shift. Vascular: No hyperdense vessel.  Atherosclerotic calcifications. Skull: Normal. Negative for fracture or focal lesion. Sinuses/Orbits: Visualized orbits show no acute finding. Redemonstrated complete opacification of the right frontal sinus. Scattered partial opacification of the bilateral ethmoid air cells. Frothy secretions within the left sphenoid sinus. Mild mucosal thickening and mucous retention cysts within the partially imaged right maxillary sinus. IMPRESSION: No evidence of acute intracranial abnormality. Mild-to-moderate cerebral atrophy and advanced cerebral white matter chronic small vessel ischemic disease. Paranasal sinus disease, as described. Electronically Signed   By: Kellie Simmering DO   On: 02/19/2021 10:01     Assessment/Plan Principal Problem:   Syncopal episodes Active Problems:   AMS (altered mental status)   Dehydration   CKD (chronic kidney disease), stage IV (HCC)     Syncope Unwitnessed and probably secondary to sinus pauses Will obtain 2D echocardiogram to rule out aortic stenosis Will request cardiology consult   Dehydration Secondary to poor oral intake At baseline patient has a serum creatinine of 1.97 but today on admission it is 2.28 Judicious IV fluid hydration.      Stage 4 CKD Stable    Altered mental status Rule out TIA vs acute stroke Initial CT scan of the head without contrast is negative for  bleed Will obtain MRI of the brain to rule out any acute infarct DVT prophylaxis: Lovenox  Code Status: full code  Family Communication: Greater than 50% of time was spent discussing patient's condition and plan of care with him and his daughter at the bedside.  All questions and concerns have been addressed.  They verbalized understanding and agree with the plan.  CODE STATUS was discussed and patient is full code. Disposition Plan: Back to previous home environment Consults called: Cardiology Status: Observation    Hillery Zachman MD [facility]     02/19/2021, 1:39 PM

## 2021-02-19 NOTE — ED Notes (Signed)
Informed RN bed assigned 

## 2021-02-19 NOTE — Consult Note (Signed)
Cardiology Consultation Note    Patient ID: Austin Fleming, MRN: [id number], DOB/AGE: 1925/10/22 87 y.o. Admit date: 02/19/2021   Date of Consult: 02/19/2021 Primary Physician: Leonel Ramsay, MD Primary Cardiologist: none  Chief Complaint: possible syncope Reason for Consultation: sinus pauses Requesting MD: Dr. Francine Graven  HPI: Austin Fleming is a 87 y.o. male with history of no apparent cardiac abnormalities who was brought to the emergency room after an apparent syncopal episode.  Apparently the patient was sitting on the edge of his bed and subsequently was found on the floor.  Patient is not able to give a history of the event.  Patient was apparently nauseated and diaphoretic at the time.  He was somewhat confused.  Creatinine was 2.28 with a baseline of 1.97.  His troponin was 12.  Chest x-ray showed no acute cardiopulmonary disease.  Head CT showed no acute abnormalities.  EKG showed sinus rhythm with occasional sinus pause.  No prolonged pauses.  No tachyarrhythmias.  Electrolytes were normal.  Subsequent brain MRI revealed an acute infarct at the junction of the left middle cerebellar peduncle and pons.  Additional punctate infarct in the high right frontoparietal white matter.  Supratentorial microhemorrhages in the gray-white matter junction suggested possible cerebral amyloid angiopathy.  This appeared embolic due to bilateral distribution.  Will follow neurology's guidance regarding further therapy be given microangiopathy, avoiding anticoagulation appears appropriate.  This would include antiplatelet therapy.  History reviewed. No pertinent past medical history.    Surgical History: History reviewed. No pertinent surgical history.   Home Meds: Prior to Admission medications   Medication Sig Start Date End Date Taking? Authorizing Provider  acetaminophen (TYLENOL) 500 MG tablet Take 500 mg by mouth every 6 (six) hours as needed.   Yes [provider]  aspirin 325 MG  tablet Take 325 mg by mouth daily.   Yes [provider]  Cholecalciferol 25 MCG (1000 UT) capsule Take 1,000 Units by mouth daily.   Yes [provider]  cyanocobalamin 1000 MCG tablet Take 1,000 mcg by mouth daily.   Yes [provider]  Misc Natural Products (OSTEO BI-FLEX ADV TRIPLE ST PO) Take 1 tablet by mouth daily.   Yes [provider]    Inpatient Medications:   aspirin  325 mg Oral Daily   [START ON 02/20/2021] cholecalciferol  1,000 Units Oral Daily   enoxaparin (LOVENOX) injection  30 mg Subcutaneous Q24H   mouth rinse  15 mL Mouth Rinse BID   sodium chloride flush  3 mL Intravenous Q12H   [START ON 02/20/2021] cyanocobalamin  1,000 mcg Oral Daily    sodium chloride      Allergies: No Known Allergies  Social History   Socioeconomic History   Marital status: Single    Spouse name: Not on file   Number of children: Not on file   Years of education: Not on file   Highest education level: Not on file  Occupational History   Not on file  Tobacco Use   Smoking status: Former    Pack years: 0.00   Smokeless tobacco: Former  Scientific laboratory technician Use: Never used  Substance and Sexual Activity   Alcohol use: Never   Drug use: Never   Sexual activity: Not Currently  Other Topics Concern   Not on file  Social History Narrative   Not on file   Social Determinants of Health   Financial Resource Strain: Not on file  Food Insecurity: Not  on file  Transportation Needs: Not on file  Physical Activity: Not on file  Stress: Not on file  Social Connections: Not on file  Intimate Partner Violence: Not on file     History reviewed. No pertinent family history.   Review of Systems: A 12-system review of systems was performed and is negative except as noted in the HPI.  Labs: No results for input(s): CKTOTAL, CKMB, TROPONINI in the last 72 hours. Lab Results  Component Value Date   WBC 11.2 (H) 02/19/2021   HGB 12.7 (L) 02/19/2021    HCT 39.0 02/19/2021   MCV 91.8 02/19/2021   PLT 247 02/19/2021    Recent Labs  Lab 02/19/21 0853  NA 138  K 4.0  CL 109  CO2 22  BUN 51*  CREATININE 2.28*  CALCIUM 8.9  GLUCOSE 130*   No results found for: CHOL, HDL, LDLCALC, TRIG No results found for: DDIMER  Radiology/Studies:  DG Chest 2 View  Result Date: 02/19/2021 CLINICAL DATA:  Weakness and confusion EXAM: CHEST - 2 VIEW COMPARISON:  None. FINDINGS: Normal heart size. Aortic tortuosity. There is no edema, consolidation, effusion, or pneumothorax. Artifact from EKG leads IMPRESSION: No evidence of active disease. Electronically Signed   By: Monte Fantasia M.D.   On: 02/19/2021 09:24   CT HEAD WO CONTRAST  Result Date: 02/19/2021 CLINICAL DATA:  Head trauma, minor. EXAM: CT HEAD WITHOUT CONTRAST TECHNIQUE: Contiguous axial images were obtained from the base of the skull through the vertex without intravenous contrast. COMPARISON:  CT head/maxillofacial 03/03/2020. FINDINGS: Brain: Mild-to-moderate cerebral atrophy. Advanced patchy and ill-defined hypoattenuation within the cerebral white matter, nonspecific but compatible with chronic small vessel ischemic disease. There is no acute intracranial hemorrhage. No demarcated cortical infarct. No extra-axial fluid collection. No evidence of intracranial mass. No midline shift. Vascular: No hyperdense vessel.  Atherosclerotic calcifications. Skull: Normal. Negative for fracture or focal lesion. Sinuses/Orbits: Visualized orbits show no acute finding. Redemonstrated complete opacification of the right frontal sinus. Scattered partial opacification of the bilateral ethmoid air cells. Frothy secretions within the left sphenoid sinus. Mild mucosal thickening and mucous retention cysts within the partially imaged right maxillary sinus. IMPRESSION: No evidence of acute intracranial abnormality. Mild-to-moderate cerebral atrophy and advanced cerebral white matter chronic small vessel ischemic  disease. Paranasal sinus disease, as described. Electronically Signed   By: Kellie Simmering DO   On: 02/19/2021 10:01    Wt Readings from Last 3 Encounters:  02/19/21 68.8 kg  04/02/20 72.6 kg    EKG: Sinus rhythm with intermittent sinus pauses.  Physical Exam: Elderly male. Blood pressure (!) 164/97, pulse 100, temperature 98 F (36.7 C), temperature source Oral, resp. rate 18, height '5\' 6"'$  (1.676 m), weight 68.8 kg, SpO2 100 %. Body mass index is 24.49 kg/m. General: Well developed, well nourished, in no acute distress. Head: Normocephalic, atraumatic, sclera non-icteric, no xanthomas, nares are without discharge.  Neck: Negative for carotid bruits. JVD not elevated. Lungs: Clear bilaterally to auscultation without wheezes, rales, or rhonchi. Breathing is unlabored. Heart: RRR with S1 S2. No murmurs, rubs, or gallops appreciated. Abdomen: Soft, non-tender, non-distended with normoactive bowel sounds. No hepatomegaly. No rebound/guarding. No obvious abdominal masses. Msk:  Strength and tone appear normal for age. Extremities: No clubbing or cyanosis. No edema.  Distal pedal pulses are 2+ and equal bilaterally. Neuro: Somewhat confused.     Assessment and Plan  87 year old with no prior cardiac abnormalities who presented emergency room with apparent syncope although this has not  been witnessed.  EKG showed sinus rhythm with occasional sinus pauses.  No prolonged pauses.  No tachyarrhythmias.  Troponin was normal thus far.  Electrolytes were unremarkable.  1.  Syncope-unclear whether this was syncope or mechanical fall.  EKG shows sinus rhythm with sinus pauses.  No prolonged bradycardia or prolonged pauses.  Doubt arrhythmia as the etiology of his fall.  We will continue to follow.  Avoid any AV nodal medications.  We will review echocardiogram when available  2.  Confusion- Brain CT unremarkable however brain MRI suggests acute infarction at the junction of the left middle cerebellar  peduncle and pons.  There is also micro hemorrhages.  This is the likely etiology of his altered sensorium as well as probably for his fall.  We will avoid any anticoagulation at present.Ivin Booty MD 02/19/2021, 1:56 PM Pager: [phone]

## 2021-02-19 NOTE — ED Triage Notes (Addendum)
Pt to ER via [facility] from home with complaints of altered mental status following an unwitnessed possible syncopal episode. Pt was found in bathroom on floor by family.   Per Ems pt's baseline is alert and oriented, pt disoriented on arrival to time place and situation. Pt also noted to be repeating phrases. Pt diaphoretic and nauseated/ dizzy. Denies chest pain. Family reports possible dehydration.  500cc normal saline bolus administered by ems.

## 2021-02-20 ENCOUNTER — Observation Stay: Payer: Medicare Other

## 2021-02-20 ENCOUNTER — Observation Stay
Admit: 2021-02-20 | Discharge: 2021-02-20 | Disposition: A | Discharge status: Home / Self Care | Payer: Medicare Other | Attending: Internal Medicine | Admitting: Internal Medicine

## 2021-02-20 DIAGNOSIS — I6322 Cerebral infarction due to unspecified occlusion or stenosis of basilar arteries: Principal | ICD-10-CM

## 2021-02-20 DIAGNOSIS — E854 Organ-limited amyloidosis: Secondary | ICD-10-CM | POA: Diagnosis not present

## 2021-02-20 DIAGNOSIS — I441 Atrioventricular block, second degree: Secondary | ICD-10-CM | POA: Diagnosis present

## 2021-02-20 DIAGNOSIS — Z87891 Personal history of nicotine dependence: Secondary | ICD-10-CM | POA: Diagnosis not present

## 2021-02-20 DIAGNOSIS — Z515 Encounter for palliative care: Secondary | ICD-10-CM | POA: Diagnosis not present

## 2021-02-20 DIAGNOSIS — R29702 NIHSS score 2: Secondary | ICD-10-CM | POA: Diagnosis present

## 2021-02-20 DIAGNOSIS — I680 Cerebral amyloid angiopathy: Secondary | ICD-10-CM | POA: Diagnosis not present

## 2021-02-20 DIAGNOSIS — Z7982 Long term (current) use of aspirin: Secondary | ICD-10-CM | POA: Diagnosis not present

## 2021-02-20 DIAGNOSIS — Z7189 Other specified counseling: Secondary | ICD-10-CM | POA: Diagnosis not present

## 2021-02-20 DIAGNOSIS — I651 Occlusion and stenosis of basilar artery: Secondary | ICD-10-CM

## 2021-02-20 DIAGNOSIS — E785 Hyperlipidemia, unspecified: Secondary | ICD-10-CM | POA: Diagnosis present

## 2021-02-20 DIAGNOSIS — H9190 Unspecified hearing loss, unspecified ear: Secondary | ICD-10-CM | POA: Diagnosis present

## 2021-02-20 DIAGNOSIS — I639 Cerebral infarction, unspecified: Secondary | ICD-10-CM | POA: Diagnosis present

## 2021-02-20 DIAGNOSIS — Z20822 Contact with and (suspected) exposure to covid-19: Secondary | ICD-10-CM | POA: Diagnosis present

## 2021-02-20 DIAGNOSIS — E860 Dehydration: Secondary | ICD-10-CM | POA: Diagnosis present

## 2021-02-20 DIAGNOSIS — N184 Chronic kidney disease, stage 4 (severe): Secondary | ICD-10-CM | POA: Diagnosis present

## 2021-02-20 DIAGNOSIS — R4189 Other symptoms and signs involving cognitive functions and awareness: Secondary | ICD-10-CM | POA: Diagnosis present

## 2021-02-20 DIAGNOSIS — R54 Age-related physical debility: Secondary | ICD-10-CM | POA: Diagnosis present

## 2021-02-20 DIAGNOSIS — R55 Syncope and collapse: Secondary | ICD-10-CM | POA: Diagnosis not present

## 2021-02-20 DIAGNOSIS — F419 Anxiety disorder, unspecified: Secondary | ICD-10-CM | POA: Diagnosis present

## 2021-02-20 DIAGNOSIS — Z79899 Other long term (current) drug therapy: Secondary | ICD-10-CM | POA: Diagnosis not present

## 2021-02-20 LAB — ECHOCARDIOGRAM COMPLETE
AR max vel: 2.52 cm2
AV Area VTI: 2.89 cm2
AV Area mean vel: 2.45 cm2
AV Mean grad: 2 mmHg
AV Peak grad: 3.7 mmHg
Ao pk vel: 0.96 m/s
Area-P 1/2: 9.14 cm2
Height: 66 in
S' Lateral: 2.3 cm
Weight: 2427.2 oz

## 2021-02-20 LAB — LIPID PANEL
Cholesterol: 220 mg/dL — ABNORMAL HIGH (ref 0–200)
HDL: 51 mg/dL (ref >40)
LDL Cholesterol: 159 mg/dL — ABNORMAL HIGH (ref 0–99)
Total CHOL/HDL Ratio: 4.3 RATIO
Triglycerides: 51 mg/dL (ref <150)
VLDL: 10 mg/dL (ref 0–40)

## 2021-02-20 LAB — BASIC METABOLIC PANEL
Anion gap: 6 (ref 5–15)
BUN: 39 mg/dL — ABNORMAL HIGH (ref 8–23)
CO2: 24 mmol/L (ref 22–32)
Calcium: 8.8 mg/dL — ABNORMAL LOW (ref 8.9–10.3)
Chloride: 110 mmol/L (ref 98–111)
Creatinine, Ser: 1.91 mg/dL — ABNORMAL HIGH (ref 0.61–1.24)
GFR, Estimated: 32 mL/min — ABNORMAL LOW (ref >60)
Glucose, Bld: 104 mg/dL — ABNORMAL HIGH (ref 70–99)
Potassium: 4.6 mmol/L (ref 3.5–5.1)
Sodium: 140 mmol/L (ref 135–145)

## 2021-02-20 LAB — CBC
HCT: 35.8 % — ABNORMAL LOW (ref 39.0–52.0)
Hemoglobin: 11.9 g/dL — ABNORMAL LOW (ref 13.0–17.0)
MCH: 30 pg (ref 26.0–34.0)
MCHC: 33.2 g/dL (ref 30.0–36.0)
MCV: 90.2 fL (ref 80.0–100.0)
Platelets: 222 10*3/uL (ref 150–400)
RBC: 3.97 MIL/uL — ABNORMAL LOW (ref 4.22–5.81)
RDW: 12.7 % (ref 11.5–15.5)
WBC: 9.8 10*3/uL (ref 4.0–10.5)
nRBC: 0 % (ref 0.0–0.2)

## 2021-02-20 LAB — GLUCOSE, CAPILLARY: Glucose-Capillary: 95 mg/dL (ref 70–99)

## 2021-02-20 MED ORDER — ATORVASTATIN CALCIUM 20 MG PO TABS
80.0000 mg | ORAL_TABLET | Freq: Every day | ORAL | Status: DC
  Administered 2021-02-21 – 2021-02-22 (×2): 80 mg via ORAL
  Filled 2021-02-20 (×2): qty 4

## 2021-02-20 MED ORDER — ASPIRIN 325 MG PO TABS
325.0000 mg | ORAL_TABLET | Freq: Every day | ORAL | Status: DC
  Administered 2021-02-20 – 2021-02-23 (×4): 325 mg via ORAL
  Filled 2021-02-20 (×4): qty 1

## 2021-02-20 MED ORDER — SODIUM CHLORIDE 0.9 % IV SOLN: INTRAVENOUS | Status: DC

## 2021-02-20 NOTE — Evaluation (Signed)
Occupational Therapy Evaluation Patient Details Name: Austin Fleming MRN: [id number] DOB: 11-Oct-1925 Today's Date: 02/20/2021    History of Present Illness Austin Fleming is a 87 y.o. male with history of no apparent cardiac abnormalities who was brought to the emergency room after an apparent syncopal episode. Patient was sitting on the edge of his bed and subsequently was found on the floor. Patient is not able to give a history of the event. Patient was apparently nauseated, diaphoretic, and somewhat confused. Subsequent brain MRI revealed an acute infarct at the junction of the left middle cerebellar peduncle and pons. Additional punctate infarct in the high right frontoparietal white matter.  Supratentorial microhemorrhages in the gray-white matter junction suggested possible cerebral amyloid angiopathy. This appeared embolic due to bilateral distribution.   Clinical Impression   Austin Fleming presents today with decreased strength, activity tolerance, coordination, cognition; and impaired balance. Prior to admission, per daughter's report, Austin Fleming had mild cognitive impairment, but appears much more confused today. He is oriented only to self and is unable to retain basic information he was given minutes earlier (e.g., 5 minutes after being told he is at the hospital, patient responds to question "where are we?" by saying "in a building.") Per daughter's report, pt normally gets out of bed, dresses himself, toilets, washes up, all with Mod I, using a RW. Today Austin Fleming requires Min/Mod A for each of these ADL tasks, as well as verbal and tactile cueing, as he appears unable to initiate or sequence tasks w/o multimodal instructions. Pt also requires greatly increased processing time. Pt displays generalized weakness throughout extremities, grossly symmetrical bilaterally. Recommend ongoing OT during hospital stay, with DC to SNF to maximize pt safety and support return to PLOF.     Follow Up  Recommendations  SNF    Equipment Recommendations  Other (comment) (defer to next venue of care)    Recommendations for Other Services       Precautions / Restrictions Precautions Precautions: Fall Restrictions Weight Bearing Restrictions: No      Mobility Bed Mobility Overal bed mobility: Needs Assistance Bed Mobility: Sit to Supine;Supine to Sit     Supine to sit: Min assist Sit to supine: Min assist   General bed mobility comments: Min A, greatly increased time, verbal and tactile cues required    Transfers Overall transfer level: Needs assistance Equipment used: Rolling walker (2 wheeled) Transfers: Sit to/from [facility];Lateral/Scoot Transfers Sit to Stand: Min assist Stand pivot transfers: Mod assist      Lateral/Scoot Transfers: Min assist General transfer comment: Min/Mod A, greatly increased time, verbal and tactile cues required    Balance Overall balance assessment: Needs assistance Sitting-balance support: Bilateral upper extremity supported Sitting balance-Leahy Scale: Fair   Postural control: Posterior lean Standing balance support: Bilateral upper extremity supported Standing balance-Leahy Scale: Poor                             ADL either performed or assessed with clinical judgement   ADL Overall ADL's : Needs assistance/impaired                     Lower Body Dressing: Moderate assistance Lower Body Dressing Details (indicate cue type and reason): Mod A to don socks Toilet Transfer: Moderate assistance;BSC;Stand-pivot;RW   Toileting- Clothing Manipulation and Hygiene: Moderate assistance               Vision Patient Visual  Report: No change from baseline       Perception     Praxis      Pertinent Vitals/Pain Pain Assessment: No/denies pain     Hand Dominance     Extremity/Trunk Assessment Upper Extremity Assessment Upper Extremity Assessment: Generalized weakness   Lower  Extremity Assessment Lower Extremity Assessment: Generalized weakness       Communication Communication Communication: Expressive difficulties   Cognition Arousal/Alertness: Awake/alert Behavior During Therapy: WFL for tasks assessed/performed Overall Cognitive Status: Impaired/Different from baseline Area of Impairment: Orientation;Attention;Memory;Awareness                               General Comments: Pt has history of cognitive impairments, but daughter reports the today he is considerably more impaired than usual.   General Comments       Exercises Other Exercises Other Exercises: bed mobility, transfers, toileting, dressing, standing/sitting balance tolerance. Educ re: role of OT, POC, DC recs   Shoulder Instructions      Home Living Family/patient expects to be discharged to:: Private residence Living Arrangements: Children Available Help at Discharge: Family;Available PRN/intermittently Type of Home: House Home Access: Stairs to enter Entrance Stairs-Number of Steps: 4   Home Layout: One level     Bathroom Shower/Tub:  (Pt does not use -- washes up standing at sink)   Biochemist, clinical: Standard     Home Equipment: Environmental consultant - 2 wheels          Prior Functioning/Environment Level of Independence: Needs assistance  Gait / Transfers Assistance Needed: Uses 2W RW ADL's / Homemaking Assistance Needed: Pt dresses, toilets, eats INDly at home. Sink baths for bathing. Daughter and son handle all cooking, cleaning, shopping, etc.            OT Problem List: Decreased strength;Decreased activity tolerance;Impaired balance (sitting and/or standing);Decreased cognition;Decreased coordination;Decreased knowledge of use of DME or AE      OT Treatment/Interventions: Self-care/ADL training;Therapeutic exercise;Patient/family education;Balance training;Therapeutic activities;DME and/or AE instruction;Cognitive remediation/compensation    OT Goals(Current  goals can be found in the care plan section) Acute Rehab OT Goals Patient Stated Goal: to go home OT Goal Formulation: With patient Time For Goal Achievement: 03/06/21 Potential to Achieve Goals: Good ADL Goals Pt Will Perform Grooming: standing;with min assist Pt Will Perform Lower Body Dressing: sitting/lateral leans;with min guard assist Pt Will Transfer to Toilet: stand pivot transfer;with min assist  OT Frequency: Min 1X/week   Barriers to D/C: Decreased caregiver support          Co-evaluation              AM-PAC OT "6 Clicks" Daily Activity     Outcome Measure Help from another person eating meals?: A Little Help from another person taking care of personal grooming?: A Little Help from another person toileting, which includes using toliet, bedpan, or urinal?: A Lot Help from another person bathing (including washing, rinsing, drying)?: A Lot Help from another person to put on and taking off regular upper body clothing?: A Little Help from another person to put on and taking off regular lower body clothing?: A Lot 6 Click Score: 15   End of Session Equipment Utilized During Treatment: Rolling walker  Activity Tolerance: Patient tolerated treatment well Patient left: in bed;with call bell/phone within reach;with bed alarm set;with family/visitor present;Other (comment) (MD in room)  OT Visit Diagnosis: Unsteadiness on feet (R26.81);Muscle weakness (generalized) (M62.81);Other symptoms and signs involving cognitive  function;Other abnormalities of gait and mobility (R26.89);Apraxia (R48.2)                Time: [id number] OT Time Calculation (min): 58 min Charges:  OT General Charges $OT Visit: 1 Visit OT Evaluation $OT Eval Moderate Complexity: 1 Mod OT Treatments $Self Care/Home Management : 53-67 mins Josiah Lobo, PhD, MS, OTR/L 02/20/21, 11:46 AM

## 2021-02-20 NOTE — Care Management Obs Status (Signed)
[facility] NOTIFICATION   Patient Details  Name: Austin Fleming MRN: [id number] Date of Birth: [birth year]   Medicare Observation Status Notification Given:  Yes    Shelbie Hutching, RN 02/20/2021, 9:54 AM

## 2021-02-20 NOTE — Progress Notes (Addendum)
SLP Cancellation Note  Patient Details Name: Austin Fleming MRN: [id number] DOB: [birth year]   Cancelled treatment:       Reason Eval/Treat Not Completed:  (unable to see pt today. Will f/u tomorrow w/ any assessment for Cognitive-linguistic needs indicated.). Pt has Baseline Dementia per Neurology note. Family has stated his memory impairment has remained stable but an acute hospitalization and lack of sleep as well as unfamiliar environment could impact Cognitive presentation. Recommend reducing distractions especially during tasks.     Orinda Kenner, MS, CCC-SLP Speech Language Pathologist Rehab Services [phone] [facility] 02/20/2021, 5:02 PM

## 2021-02-20 NOTE — TOC Initial Note (Signed)
Transition of Care [facility]) - Initial/Assessment Note    Patient Details  Name: Austin Fleming MRN: [id number] Date of Birth: 06-08-1926  [facility]) CM/SW Contact:    Austin Hutching, RN Phone Number: 02/20/2021, 10:10 AM  Clinical Narrative:                 Patient placed under observation for syncopal episode and altered mental status, patient given IV fluids for dehydration.  RNCM met with patient at the bedside this morning.  OT working with patient and patient's daughter and son in law also at the bedside.  Daughter Austin Fleming reports that patient is from home where he is independent in ADL's but does not drive.  Patient's son lives with him but is only there at night.  Patient walks with a walker and cane.  Family provides transportation.  Family is interested in skilled nursing for rehab.  [facility] reviewed with patient and family and they are aware that patient needs a 3 day medically necessary inpatient stay to qualify for SNF.   TOC will follow.   Expected Discharge Plan: [place] Barriers to Discharge: Continued Medical Work up   Patient Goals and CMS Choice Patient states their goals for this hospitalization and ongoing recovery are:: Patient working with OT- family interested in SNF CMS Medicare.gov Compare Post Acute Care list provided to:: Patient Choice offered to / list presented to : Patient, Adult Children  Expected Discharge Plan and Services Expected Discharge Plan: [place]   Discharge Planning Services: CM Consult Post Acute Care Choice: [place] Living arrangements for the past 2 months: Single Family Home                 DME Arranged: N/A DME Agency: NA                  Prior Living Arrangements/Services Living arrangements for the past 2 months: Single Family Home Lives with:: Adult Children Patient language and need for interpreter reviewed:: Yes Do you feel safe going back to the place where you  live?: Yes      Need for Family Participation in Patient Care: Yes (Comment) (dehydration) Care giver support system in place?: Yes (comment) (daughter and son) Current home services: DME (walker and cane) Criminal Activity/Legal Involvement Pertinent to Current Situation/Hospitalization: No - Comment as needed  Activities of Daily Living Home Assistive Devices/Equipment: Cane (specify quad or straight), Walker (specify type), Grab bars in shower, Grab bars around toilet, Raised toilet seat with rails ADL Screening (condition at time of admission) Patient's cognitive ability adequate to safely complete daily activities?: Yes Is the patient deaf or have difficulty hearing?: No Does the patient have difficulty seeing, even when wearing glasses/contacts?: No Does the patient have difficulty concentrating, remembering, or making decisions?: Yes Patient able to express need for assistance with ADLs?: No Does the patient have difficulty dressing or bathing?: Yes Independently performs ADLs?: No Communication: Independent Dressing (OT): Needs assistance Is this a change from baseline?: Pre-admission baseline Grooming: Independent Feeding: Independent Bathing: Needs assistance Is this a change from baseline?: Pre-admission baseline Toileting: Needs assistance Is this a change from baseline?: Pre-admission baseline In/Out Bed: Independent Walks in Home: Independent with device (comment) Does the patient have difficulty walking or climbing stairs?: Yes Weakness of Legs: Both Weakness of Arms/Hands: None  Permission Sought/Granted Permission sought to share information with : Case Manager, Family Supports Permission granted to share information with : Yes, Verbal Permission Granted  Share Information with NAME: Austin Fleming     Permission granted to share info w Relationship: daughter     Emotional Assessment Appearance:: Appears stated age Attitude/Demeanor/Rapport: Engaged Affect  (typically observed): Accepting Orientation: : Oriented to Self, Oriented to Place, Oriented to  Time Alcohol / Substance Use: Not Applicable Psych Involvement: No (comment)  Admission diagnosis:  Dehydration [E86.0] Delirium [R41.0] AMS (altered mental status) [R41.82] Acute CVA (cerebrovascular accident) [facility]) [I63.9] Patient Active Problem List   Diagnosis Date Noted   AMS (altered mental status) 02/19/2021   Syncopal episodes 02/19/2021   Dehydration 02/19/2021   CKD (chronic kidney disease), stage IV ([place]) 02/19/2021   Acute CVA (cerebrovascular accident) ([place]) 02/19/2021   PCP:  Leonel Ramsay, MD Pharmacy:   [id number] [place], [place] [id number]: [phone]: [phone]    Social Determinants of Health (SDOH) Interventions    Readmission Risk Interventions No flowsheet data found.

## 2021-02-20 NOTE — Progress Notes (Signed)
*  PRELIMINARY RESULTS* Echocardiogram 2D Echocardiogram has been performed.  Sherrie Sport 02/20/2021, 7:51 AM

## 2021-02-20 NOTE — Plan of Care (Signed)

## 2021-02-20 NOTE — Consult Note (Signed)
Neurology Consultation Reason for Consult: Stroke on MRI Requesting Physician: Tochukwu Agbata  CC: Unwitnessed fall   History is obtained from: Chart review, daughter, son-in-law at bedside, limited history from patient  HPI: Austin Fleming is a 87 y.o. male with chronic kidney disease, memory impairment, frailty, anxiety  Per primary team's H&P note, confirmed with daughter at bedside he was seen in the morning sitting at the edge of the bed and then later was found on the floor after a loud sound was heard.  He was found to be diaphoretic and complaining of nausea but otherwise intact.  At this time she reports he is generally a little slower and more confused than normal but has no new focal issues.  There have been concerns for generalized decline over the past few months.  He recently established care with primary care physician on 09/19/2020.  At that appointment it was noted that he was not oriented to year, month, or season, nor president, though he was oriented to [place].  Family was mainly concerned that he had been getting generally weaker and felt like he may benefit from some home health assistance.  His memory loss was felt to be stable by family and they noted he does not drive although he is living in his own home with his son and walking with a four-point walker  LKW: ~7:30 AM on 6/13 tPA given?: No, essentially back to baseline at time of arrival to ED  Premorbid modified rankin scale: 1-2      1 - No significant disability. Able to carry out all usual activities, despite some symptoms.     2 - Slight disability. Able to look after own affairs without assistance, but unable to carry out all previous activities.  Daughter notes that he was driving until age 4, and while family manages the cooking and paying bills etc., the patient still takes care of all of his activities of daily living such as bathing and dressing, is ambulatory at baseline, and even takes his medications  (daily aspirin that he has been taking for years and vitamins) on his own without needing family support though they confirm that he has taking these every day.  ROS: Unable to obtain due to baseline dementia, as well as a baseline tendency to "joke" with doctors.  Daughter denies any past known history of significant traumatic brain injury though the patient did serve in the [employer] for 22 years she reports she has heard all of the stories and thinks she would know if there was ever a serious head injury.  Patient reports that maybe he has had head injury before.  History reviewed. No pertinent past medical history. Other than noted in HPI above   History reviewed. No pertinent family history.  Daughter reports that the patient is the youngest of 68 siblings, who all live released into their 82s and did not have any dementia.  Patient's father's history is unknown but patient's mother also was in her 86s or 73s without any dementia.  Social History:  reports that he has quit smoking. He has been exposed to tobacco smoke. He has quit using smokeless tobacco. He reports that he does not drink alcohol and does not use drugs. Served in the [employer] and received some care at the [place] but was not seen for many years by physicians until recently  Exam: Current vital signs: BP 122/68 (BP Location: Left Arm)   Pulse 85   Temp 97.9 F (36.6 C) (Oral)  Resp 16   Ht '5\' 6"'$  (1.676 m)   Wt 68.8 kg   SpO2 99%   BMI 24.49 kg/m  Vital signs in last 24 hours: Temp:  [97.6 F (36.4 C)-98.6 F (37 C)] 97.9 F (36.6 C) (06/14 0526) Pulse Rate:  [83-100] 85 (06/14 0526) Resp:  [16-22] 16 (06/14 0526) BP: (122-167)/(59-100) 122/68 (06/14 0526) SpO2:  [97 %-100 %] 99 % (06/14 0526) Weight:  [68.8 kg] 68.8 kg (06/13 0843)   Physical Exam  Constitutional: Appears thin but overall well Psych: Affect at his baseline, often has tangential answers to questions or deliberately answers the questions incorrectly per  family even when he reportedly actually knows answers Eyes: No scleral injection HENT: No oropharyngeal obstruction.  Retains only 2 or 3 teeth MSK: no significant joint deformities.  Cardiovascular: Normal rate and regular rhythm.  Respiratory: Effort normal, non-labored breathing GI: Soft.  No distension. There is no tenderness.  Skin: Warm dry and intact visible skin  Neuro: Mental Status: Patient is awake, alert, oriented to person only.  Unclear how much of his disorientation is baseline/improves with coaching by family or secondary to his personality and "joking" Patient is able to give a limited history and is frequently very tangential in his answers.  However his speech is fluent and naming appears intact.  No neglect Cranial Nerves: II: Visual Fields are full to orienting to stimuli. Pupils are equal, round, and reactive to light though he does have significant cataracts bilaterally III,IV, VI: EOMI without ptosis or diploplia.  Pursuits are very saccadic and upgaze is limited.  He also has possible limitation in lateral gaze versus not following the commands well V: Facial sensation is symmetric to light touch VII: Facial movement is symmetric.  VIII: hearing is intact to voice (hard of hearing at baseline) X: Uvula elevates symmetrically XI: Shoulder shrug is symmetric. XII: tongue is midline without atrophy or fasciculations.  Motor: Tone is normal. Bulk is normal. 5/5 strength was present in all four extremities other than bilateral hip flexion weakness 4/5, with tremulousness that family reports has been baseline for many years. Sensory: Sensation is symmetric to light touch in the arms and legs,  Deep Tendon Reflexes: 2+ and symmetric in the biceps and patellae.  Cerebellar: FNF and HKS are intact bilaterally  NIHSS total 2 Score breakdown: 1 for possible gaze palsy (not lateralizing, just generally), 1 for incorrect answer to month   I have reviewed labs in epic and  the results pertinent to this consultation are: Creatinine 1.91 with GFR of 32 Lab Results  Component Value Date   CHOL 220 (H) 02/20/2021   HDL 51 02/20/2021   LDLCALC 159 (H) 02/20/2021   TRIG 51 02/20/2021   CHOLHDL 4.3 02/20/2021   No results found for: HGBA1C (pending) CBC with mild normocytic anemia with a hemoglobin of 11.9, otherwise unremarkable   09/19/2020 care everywhere labs  TSH normal at 1.578 B12 greater than 1500  I have reviewed the images obtained:  MRI brain personally reviewed, infarct in the left cerebellar peduncle as well as a punctate infarct in the right PCA/MCA watershed territory  MRA brain personally reviewed, severe basilar artery stenosis and right vertebral artery stenosis  Carotid ultrasound:  1. Mild (1-49%) stenosis proximal right internal carotid artery secondary to heterogeneous and irregular atherosclerotic plaque. 2. Mild (1-49%) stenosis proximal left internal carotid artery secondary to heterogeneous and irregular atherosclerotic plaque. 3. Vertebral arteries are patent with normal antegrade flow.  Echocardiogram   1.  Left ventricular ejection fraction, by estimation, is 55 to 60%. The  left ventricle has normal function. The left ventricle has no regional  wall motion abnormalities. There is moderate left ventricular hypertrophy.  Left ventricular diastolic  parameters are consistent with Grade I diastolic dysfunction (impaired  relaxation).   2. Right ventricular systolic function is normal. The right ventricular  size is normal.   3. The mitral valve is grossly normal. Trivial mitral valve  regurgitation.   4. The aortic valve is grossly normal. Aortic valve regurgitation is  trivial.   Impression: This is a 87 year old man with past medical history as above presenting with a syncopal episode concerning for brainstem hypoperfusion given posterior circulation distribution strokes and severe basilar artery stenosis.  Unfortunately  MRI is also concerning for CAA.  Discussed that diffuse axonal injury secondary to traumatic brain injury could also potentially have a similar pattern of microhemorrhages but family does not think he has had any such event.  Additionally he has been tolerating aspirin 325 mg for years.  We discussed that normally I would choose dual antiplatelet therapy for at least 3 weeks in the setting of his significant basilar artery stenosis however given possibility of CAA this would be high risk and even aspirin alone has risk of hemorrhage in CAA.  In shared decision-making with daughter at bedside, confirmed we will continue aspirin for now, add atorvastatin for his hyperlipidemia, and she will have goals of care discussion with family to discuss what interventions they might want should he have basilar artery occlusion or lobar hemorrhage in the future.  Recommendations: -Maintain adequate hydration/nutrition to avoid hypotension, goal normotension -Continue aspirin 325 mg daily -Atorvastatin 80 mg nightly -Appreciate PT/OT eval -Family prefers outpatient follow-up with [facility] neurology, should be given number to call for appointment [phone] -Continued goals of care discussion with family on an outpatient basis given high risk of lobar hemorrhage and cerebral amyloid angiopathy as well as high risk of basilar artery occlusion given his severe stenosis -No further inpatient neurological workup indicated, please repage neurology if additional questions arise   Lesleigh Noe MD-PhD [facility] [phone] [facility] coverage for [facility] is from 8 AM to 4 AM in-house and 4 PM to 8 PM by telephone/video. 8 PM to 8 AM emergent questions or overnight urgent questions should be addressed to Teleneurology On-call or [facility] neurohospitalist; contact information can be found on AMION

## 2021-02-20 NOTE — Care Management CC44 (Deleted)
Condition Code 44 Documentation Completed  Patient Details  Name: Austin Fleming MRN: [id number] Date of Birth: July 27, 1926   Condition Code 44 given:    Patient signature on Condition Code 44 notice:    Documentation of 2 MD's agreement:    Code 44 added to claim:       Shelbie Hutching, RN 02/20/2021, 9:54 AM

## 2021-02-20 NOTE — Progress Notes (Signed)
no  Patient Name: Austin Fleming Date of Encounter: 02/20/2021  Hospital Problem List     Principal Problem:   Syncopal episodes Active Problems:   AMS (altered mental status)   Dehydration   CKD (chronic kidney disease), stage IV (HCC)   Acute CVA (cerebrovascular accident) [facility])    Patient Profile     87 y.o. male with history of no apparent cardiac abnormalities who was brought to the emergency room after an apparent syncopal episode.  Apparently the patient was sitting on the edge of his bed and subsequently was found on the floor.  Patient is not able to give a history of the event.  Patient was apparently nauseated and diaphoretic at the time.  He was somewhat confused.  Creatinine was 2.28 with a baseline of 1.97.  His troponin was 12.  Chest x-ray showed no acute cardiopulmonary disease.  Head CT showed no acute abnormalities.  EKG showed sinus rhythm with occasional sinus pause.  No prolonged pauses.  No tachyarrhythmias.  Electrolytes were normal.  Subsequent brain MRI revealed an acute infarct at the junction of the left middle cerebellar peduncle and pons.  Additional punctate infarct in the high right frontoparietal white matter.  Supratentorial microhemorrhages in the gray-white matter junction suggested possible cerebral amyloid angiopathy.  This appeared embolic due to bilateral distribution.  Will follow neurology's guidance regarding further therapy be given microangiopathy, avoiding anticoagulation appears appropriate.  This would include antiplatelet therapy.  Subjective   No complaints  Inpatient Medications      stroke: mapping our early stages of recovery book   Does not apply Once   aspirin  325 mg Oral Daily   [START ON 02/21/2021] atorvastatin  80 mg Oral QHS   cholecalciferol  1,000 Units Oral Daily   mouth rinse  15 mL Mouth Rinse BID   sodium chloride flush  3 mL Intravenous Q12H   cyanocobalamin  1,000 mcg Oral Daily    Vital Signs    Vitals:    02/19/21 2354 02/20/21 0526 02/20/21 0901 02/20/21 1115  BP: 126/76 122/68 (!) 155/90 137/76  Pulse: 87 85 79 77  Resp: (!) '22 16 18 16  '$ Temp: 98.6 F (37 C) 97.9 F (36.6 C) 98 F (36.7 C) 98.2 F (36.8 C)  TempSrc:  Oral  Oral  SpO2: 98% 99% 100% 99%  Weight:      Height:        Intake/Output Summary (Last 24 hours) at 02/20/2021 1325 Last data filed at 02/20/2021 1023 Gross per 24 hour  Intake 776.82 ml  Output 400 ml  Net 376.82 ml   Filed Weights   02/19/21 0843  Weight: 68.8 kg    Physical Exam    GEN: Well nourished, well developed, in no acute distress.  HEENT: normal.  Neck: Supple, no JVD, carotid bruits, or masses. Cardiac: RRR, no murmurs, rubs, or gallops. No clubbing, cyanosis, edema.  Radials/DP/PT 2+ and equal bilaterally.  Respiratory:  Respirations regular and unlabored, clear to auscultation bilaterally. GI: Soft, nontender, nondistended, BS + x 4. MS: no deformity or atrophy. Skin: warm and dry, no rash. Neuro:  Strength and sensation are intact. Psych: Normal affect.  Labs    CBC Recent Labs    02/19/21 0853 02/20/21 0605  WBC 11.2* 9.8  HGB 12.7* 11.9*  HCT 39.0 35.8*  MCV 91.8 90.2  PLT 247 [id number]   Basic Metabolic Panel Recent Labs    02/19/21 0853 02/20/21 0605  NA 138 140  K 4.0 4.6  CL 109 110  CO2 22 24  GLUCOSE 130* 104*  BUN 51* 39*  CREATININE 2.28* 1.91*  CALCIUM 8.9 8.8*   Liver Function Tests No results for input(s): AST, ALT, ALKPHOS, BILITOT, PROT, ALBUMIN in the last 72 hours. No results for input(s): LIPASE, AMYLASE in the last 72 hours. Cardiac Enzymes No results for input(s): CKTOTAL, CKMB, CKMBINDEX, TROPONINI in the last 72 hours. BNP No results for input(s): BNP in the last 72 hours. D-Dimer No results for input(s): DDIMER in the last 72 hours. Hemoglobin A1C No results for input(s): HGBA1C in the last 72 hours. Fasting Lipid Panel Recent Labs    02/20/21 0605  CHOL 220*  HDL 51  LDLCALC 159*   TRIG 51  CHOLHDL 4.3   Thyroid Function Tests No results for input(s): TSH, T4TOTAL, T3FREE, THYROIDAB in the last 72 hours.  Invalid input(s): FREET3  Telemetry       ECG       Radiology    DG Chest 2 View  Result Date: 02/19/2021 CLINICAL DATA:  Weakness and confusion EXAM: CHEST - 2 VIEW COMPARISON:  None. FINDINGS: Normal heart size. Aortic tortuosity. There is no edema, consolidation, effusion, or pneumothorax. Artifact from EKG leads IMPRESSION: No evidence of active disease. Electronically Signed   By: Monte Fantasia M.D.   On: 02/19/2021 09:24   CT HEAD WO CONTRAST  Result Date: 02/19/2021 CLINICAL DATA:  Head trauma, minor. EXAM: CT HEAD WITHOUT CONTRAST TECHNIQUE: Contiguous axial images were obtained from the base of the skull through the vertex without intravenous contrast. COMPARISON:  CT head/maxillofacial 03/03/2020. FINDINGS: Brain: Mild-to-moderate cerebral atrophy. Advanced patchy and ill-defined hypoattenuation within the cerebral white matter, nonspecific but compatible with chronic small vessel ischemic disease. There is no acute intracranial hemorrhage. No demarcated cortical infarct. No extra-axial fluid collection. No evidence of intracranial mass. No midline shift. Vascular: No hyperdense vessel.  Atherosclerotic calcifications. Skull: Normal. Negative for fracture or focal lesion. Sinuses/Orbits: Visualized orbits show no acute finding. Redemonstrated complete opacification of the right frontal sinus. Scattered partial opacification of the bilateral ethmoid air cells. Frothy secretions within the left sphenoid sinus. Mild mucosal thickening and mucous retention cysts within the partially imaged right maxillary sinus. IMPRESSION: No evidence of acute intracranial abnormality. Mild-to-moderate cerebral atrophy and advanced cerebral white matter chronic small vessel ischemic disease. Paranasal sinus disease, as described. Electronically Signed   By: Kellie Simmering DO    On: 02/19/2021 10:01   MR ANGIO HEAD WO CONTRAST  Result Date: 02/19/2021 CLINICAL DATA:  Stroke follow-up EXAM: MRA HEAD WITHOUT CONTRAST TECHNIQUE: Angiographic images of the Circle of Willis were acquired using MRA technique without intravenous contrast. COMPARISON:  No pertinent prior exam. FINDINGS: POSTERIOR CIRCULATION: --Vertebral arteries: Poor flow related enhancement of the right V4 segment. Normal left. --Inferior cerebellar arteries: Left dominant PICA. --Basilar artery: Multifocal severe stenosis of the basilar artery. --Superior cerebellar arteries: Normal. --Posterior cerebral arteries: Normal. ANTERIOR CIRCULATION: --Intracranial internal carotid arteries: Irregularity of the right internal carotid artery at the skull base may be artifactual. --Anterior cerebral arteries (ACA): Normal. --Middle cerebral arteries (MCA): Normal. ANATOMIC VARIANTS: Bilateral fetal origins of both posterior cerebral arteries. IMPRESSION: 1. No emergent large vessel occlusion. 2. Severe stenosis of the basilar artery. 3. Poor flow related enhancement of the right vertebral artery V4 segment, likely due to high-grade stenosis 4. Irregularity of the right internal carotid artery at the skull base, possibly artifactual. Electronically Signed   By: Cletus Gash.D.  On: 02/19/2021 23:09   MR BRAIN WO CONTRAST  Result Date: 02/19/2021 CLINICAL DATA:  Dizziness. EXAM: MRI HEAD WITHOUT CONTRAST TECHNIQUE: Multiplanar, multiecho pulse sequences of the brain and surrounding structures were obtained without intravenous contrast. COMPARISON:  CT head from the same day. FINDINGS: Brain: Acute infarct at the junction of the left middle cerebellar peduncle and pons. Additional punctate infarct in the high right frontoparietal perirolandic white matter. Mild associated edema without mass effect. Additional advanced patchy and confluent T2/FLAIR hyperintensities in the white matter, nonspecific but most likely related to  chronic microvascular ischemic disease. Mild to moderate generalized atrophy. Numerous supratentorial small cortical foci of susceptibility artifact throughout the cerebrum which are predominantly located at the gray-white matter junction with a somewhat posterior predominance, compatible with prior microhemorrhages. No evidence of acute hemorrhage. No hydrocephalus, mass lesion, or sizable extra-axial fluid collection. Vascular: Major arterial flow voids are maintained at the skull base. Small vertebrobasilar system, most likely related to bilateral posterior communicating arteries. Skull and upper cervical spine: Normal marrow signal. Sinuses/Orbits: Mild ethmoid air cell and inferior maxillary sinus mucosal thickening. Completely opacified right frontal sinus. Other: No mastoid effusions. IMPRESSION: 1. Acute infarct at the junction of the left middle cerebellar peduncle and pons. Additional punctate infarct in the high right frontoparietal perirolandic white matter. Mild associated edema without mass effect. Given involvement of different vascular territories, consider embolic etiology. 2. Advanced chronic microvascular ischemic disease and mild to moderate generalized atrophy. 3. Numerous supratentorial microhemorrhages predominantly at the gray-white matter junction, which is suggestive of cerebral amyloid angiopathy. 4. Paranasal sinus disease with completely opacified right frontal sinus. Electronically Signed   By: Margaretha Sheffield MD   On: 02/19/2021 14:21   US Carotid Bilateral  Result Date: 02/20/2021 CLINICAL DATA:  87 year old male with symptoms of stroke EXAM: BILATERAL CAROTID DUPLEX ULTRASOUND TECHNIQUE: Pearline Cables scale imaging, color Doppler and duplex ultrasound were performed of bilateral carotid and vertebral arteries in the neck. COMPARISON:  None. FINDINGS: Criteria: Quantification of carotid stenosis is based on velocity parameters that correlate the residual internal carotid diameter with  NASCET-based stenosis levels, using the diameter of the distal internal carotid lumen as the denominator for stenosis measurement. The following velocity measurements were obtained: RIGHT ICA: 94/29 cm/sec CCA: [id number] cm/sec SYSTOLIC ICA/CCA RATIO:  1.1 ECA:  139 cm/sec LEFT ICA: 70/21 cm/sec CCA: [id number] cm/sec SYSTOLIC ICA/CCA RATIO:  1.1 ECA:  69 cm/sec RIGHT CAROTID ARTERY: Heterogeneous atherosclerotic plaque with areas of irregularity throughout the common carotid artery. Plaque extends into the proximal internal carotid artery. By peak systolic velocity criteria, the estimated stenosis is less than 50%. RIGHT VERTEBRAL ARTERY:  Patent with normal antegrade flow. LEFT CAROTID ARTERY: Moderate heterogeneous and irregular atherosclerotic plaque in the distal common carotid artery and proximal internal carotid artery. By peak systolic velocity criteria, the estimated stenosis is less than 50%. LEFT VERTEBRAL ARTERY:  Patent with antegrade flow. IMPRESSION: 1. Mild (1-49%) stenosis proximal right internal carotid artery secondary to heterogeneous and irregular atherosclerotic plaque. 2. Mild (1-49%) stenosis proximal left internal carotid artery secondary to heterogeneous and irregular atherosclerotic plaque. 3. Vertebral arteries are patent with normal antegrade flow. Signed, Criselda Peaches, MD, [facility] Radiology Electronically Signed   By: Jacqulynn Cadet M.D.   On: 02/20/2021 08:05   ECHOCARDIOGRAM COMPLETE  Result Date: 02/20/2021    ECHOCARDIOGRAM REPORT   Patient Name:   Austin Fleming Date of Exam: 02/20/2021 Medical Rec #:  [id number]  Height:       66.0 in Accession #:    [id number]    Weight:       151.7 lb Date of Birth:  1926/01/25     BSA:          1.778 m Patient Age:    29 years      BP:           122/68 mmHg Patient Gender: M             HR:           85 bpm. Exam Location:  [facility] Procedure: 2D Echo, Cardiac Doppler and Color Doppler  Indications:     Syncope R55  History:         Patient has no prior history of Echocardiogram examinations. No                  medical history on file.  Sonographer:     Sherrie Sport RDCS (AE) Referring Phys:  [id number] Royce Macadamia AGBATA Diagnosing Phys: Bartholome Bill MD IMPRESSIONS  1. Left ventricular ejection fraction, by estimation, is 55 to 60%. The left ventricle has normal function. The left ventricle has no regional wall motion abnormalities. There is moderate left ventricular hypertrophy. Left ventricular diastolic parameters are consistent with Grade I diastolic dysfunction (impaired relaxation).  2. Right ventricular systolic function is normal. The right ventricular size is normal.  3. The mitral valve is grossly normal. Trivial mitral valve regurgitation.  4. The aortic valve is grossly normal. Aortic valve regurgitation is trivial. FINDINGS  Left Ventricle: Left ventricular ejection fraction, by estimation, is 55 to 60%. The left ventricle has normal function. The left ventricle has no regional wall motion abnormalities. The left ventricular internal cavity size was normal in size. There is  moderate left ventricular hypertrophy. Left ventricular diastolic parameters are consistent with Grade I diastolic dysfunction (impaired relaxation). Right Ventricle: The right ventricular size is normal. No increase in right ventricular wall thickness. Right ventricular systolic function is normal. Left Atrium: Left atrial size was normal in size. Right Atrium: Right atrial size was normal in size. Pericardium: There is no evidence of pericardial effusion. Mitral Valve: The mitral valve is grossly normal. Trivial mitral valve regurgitation. Tricuspid Valve: The tricuspid valve is grossly normal. Tricuspid valve regurgitation is trivial. Aortic Valve: The aortic valve is grossly normal. Aortic valve regurgitation is trivial. Aortic valve mean gradient measures 2.0 mmHg. Aortic valve peak gradient measures 3.7 mmHg. Aortic  valve area, by VTI measures 2.89 cm. Pulmonic Valve: The pulmonic valve was not well visualized. Pulmonic valve regurgitation is trivial. Aorta: The aortic root is normal in size and structure. IAS/Shunts: The atrial septum is grossly normal.  LEFT VENTRICLE PLAX 2D LVIDd:         3.30 cm  Diastology LVIDs:         2.30 cm  LV e' medial:    10.30 cm/s LV PW:         1.10 cm  LV E/e' medial:  10.5 LV IVS:        1.00 cm  LV e' lateral:   5.33 cm/s LVOT diam:     2.00 cm  LV E/e' lateral: 20.3 LV SV:         48 LV SV Index:   27 LVOT Area:     3.14 cm  RIGHT VENTRICLE RV Basal diam:  2.70 cm RV S prime:     10.60 cm/s TAPSE (M-mode): 3.1  cm LEFT ATRIUM             Index       RIGHT ATRIUM           Index LA diam:        1.70 cm 0.96 cm/m  RA Area:     12.90 cm LA Vol (A2C):   21.5 ml 12.09 ml/m RA Volume:   26.20 ml  14.73 ml/m LA Vol (A4C):   18.7 ml 10.52 ml/m LA Biplane Vol: 20.8 ml 11.70 ml/m  AORTIC VALVE                   PULMONIC VALVE AV Area (Vmax):    2.52 cm    RVOT Peak grad: 2 mmHg AV Area (Vmean):   2.45 cm AV Area (VTI):     2.89 cm AV Vmax:           96.10 cm/s AV Vmean:          73.500 cm/s AV VTI:            0.165 m AV Peak Grad:      3.7 mmHg AV Mean Grad:      2.0 mmHg LVOT Vmax:         77.00 cm/s LVOT Vmean:        57.300 cm/s LVOT VTI:          0.152 m LVOT/AV VTI ratio: 0.92  AORTA Ao Root diam: 3.70 cm MITRAL VALVE                TRICUSPID VALVE MV Area (PHT): 9.14 cm     TR Peak grad:   19.9 mmHg MV Decel Time: 83 msec      TR Vmax:        223.00 cm/s MV E velocity: 108.00 cm/s MV A velocity: 51.80 cm/s   SHUNTS MV E/A ratio:  2.08         Systemic VTI:  0.15 m                             Systemic Diam: 2.00 cm Bartholome Bill MD Electronically signed by Bartholome Bill MD Signature Date/Time: 02/20/2021/10:38:19 AM    Final     Assessment & Plan    87 year old with no prior cardiac abnormalities who presented emergency room with apparent syncope although this has not been witnessed.   EKG showed sinus rhythm with occasional sinus pauses.  No prolonged pauses.  No tachyarrhythmias.  Troponin was normal thus far.  Electrolytes were unremarkable.  1.  Syncope-unclear whether this was syncope or mechanical fall.  EKG shows sinus rhythm with sinus pauses.  No prolonged bradycardia or prolonged pauses.  Doubt arrhythmia as the etiology of his fall.  We will continue to follow.  Avoid any AV nodal medications.  We will review echocardiogram when available  2.  Confusion- Brain CT unremarkable however brain MRI suggests acute infarction at the junction of the left middle cerebellar peduncle and pons.  There is also micro hemorrhages.  This is the likely etiology of his altered sensorium as well as probably for his fall.  We will avoid any anticoagulation at present.Joyce Gross Zelene Barga MD 02/20/2021, 1:25 PM  Pager: [phone]

## 2021-02-20 NOTE — Progress Notes (Signed)
PROGRESS NOTE  Austin Fleming [id number] DOB: 08-18-1926   PCP: Leonel Ramsay, MD  Patient is from: Home.  Lives with his son.  Uses walker at baseline.  DOA: 02/19/2021 LOS: 0  Chief complaints: Unwitnessed fall  Brief Narrative / Interim history: 87 year old M with PMH of CKD-4 and cognitive impairment brought to ED by EMS after unwitnessed fall.  Reportedly nauseous and diaphoretic after fall.  No notable traumatic injury or LOC.  In ED, hemodynamically stable.  No significant finding on basic labs.  CT head without acute finding.  EKG sinus rhythm with sinus pauses.  Initially concerned about syncopal episode but MRI revealed acute infarct at the junction of left middle cerebral peduncle and pons and punctate infarct in the high right frontal parietal perirolandic white matter with mild associated edema without mass-effect and numerous supratentorial microhemorrhages predominantly at the gray-white matter junction suggestive for cerebral amyloid angiopathy and paranasal sinus disease with completely opacified right frontal sinus.  Cardiology and neurology consulted.  Routine CVA work-up initiated.  MRA brain with severe basilar artery stenosis.  Carotid ultrasound with mild disease bilaterally.   Neurology recommended full dose aspirin with atorvastatin, outpatient follow-up and goal of care discussion.   Subjective: Seen and examined earlier this morning.  No major events overnight of this morning.  No complaints but not a great historian.  He is only oriented to self and family.  He does not know he is in the hospital.  He responds no to pain, shortness of breath, nausea or vomiting.  Objective: Vitals:   02/19/21 2354 02/20/21 0526 02/20/21 0901 02/20/21 1115  BP: 126/76 122/68 (!) 155/90 137/76  Pulse: 87 85 79 77  Resp: (!) '22 16 18 16  '$ Temp: 98.6 F (37 C) 97.9 F (36.6 C) 98 F (36.7 C) 98.2 F (36.8 C)  TempSrc:  Oral  Oral  SpO2: 98% 99% 100% 99%  Weight:       Height:        Intake/Output Summary (Last 24 hours) at 02/20/2021 1209 Last data filed at 02/20/2021 1023 Gross per 24 hour  Intake 776.82 ml  Output 400 ml  Net 376.82 ml   Filed Weights   02/19/21 0843  Weight: 68.8 kg    Examination:  GENERAL: No apparent distress.  Nontoxic. HEENT: MMM.  Vision grossly intact.  Diminished hearing. NECK: Supple.  No apparent JVD.  RESP: On RA.  No IWOB.  Fair aeration bilaterally. CVS: Occasional skipped beats.  Heart sounds normal.  ABD/GI/GU: BS+. Abd soft, NTND.  MSK/EXT:  Moves extremities. No apparent deformity. No edema.  SKIN: no apparent skin lesion or wound NEURO: Awake but not quite alert.  Oriented only to self and family.  Follows some commands.  Cranial, motor, reflexes symmetric but neuro exam is limited due to patient's inability to follow commands PSYCH: Calm. Normal affect.   Procedures:  None  Microbiology summarized: [id number] and influenza PCR nonreactive.  Assessment & Plan: Acute CVA/severe basilar artery stenosis-MRI brain, MRA & CUS as above. Concern for embolic CVA Acute encephalopathy: Likely due to the above with possible underlying dementia.  Patient is awake but note quite alert.  Follows some commands.  Only oriented to self and family at this time.  Patient's daughter is concerned about his cognition and weakness.  Therapy recommended SNF. -Follow echocardiogram -Full dose aspirin and high intensity statin per neurology -Outpatient follow-up with cardiology and neurology on discharge -Continue PT/OT -Reorientation and delirium precautions.  Sinus pauses/Mobitz type-1:  Patient is not on nodal blocking agent or Aricept. -Appreciate input by cardiology. -Avoid nodal blocking agents -Follow echocardiogram  CKD-4/azotemia: Cr slightly higher than baseline likely from dehydration.  Improved. Recent Labs    04/02/20 2118 04/03/20 0334 02/19/21 0853 02/20/21 0605  BUN 42* 41* 51* 39*  CREATININE 2.21*  1.97* 2.28* 1.91*  -Continue IV fluid hydration -Monitor urine output -Avoid nephrotoxic meds.  Normocytic anemia/anemia of renal disease: H&H stable. -Monitor.  Goal of care counseling: Elderly male with CVA,  CKD-4 and underlying cognitive impairment.  Still full code.  I believe aggressive measures such as CPR and intubation would pose more harm than benefit.  I have discussed this with patient's daughter at bedside.  Recommended DNR/DNI.  Patient and daughter would like to discuss this with her brother and let us know. -Palliative medicine consulted to facilitate discussions with daughter's permission.   Body mass index is 24.49 kg/m.         DVT prophylaxis:  Place and maintain sequential compression device Start: 02/19/21 1623  Code Status: Full code Family Communication: Patient and/or RN.  Updated patient's daughter at bedside Level of care: Med-Surg Status is: Observation  The patient will require care spanning > 2 midnights and should be moved to inpatient because: Altered mental status, Ongoing diagnostic testing needed not appropriate for outpatient work up, Unsafe d/c plan, IV treatments appropriate due to intensity of illness or inability to take PO, and Inpatient level of care appropriate due to severity of illness  Dispo: The patient is from: Home              Anticipated d/c is to: SNF              Patient currently is not medically stable to d/c.   Difficult to place patient No       Consultants:  Neurology Cardiology   Sch Meds:  Scheduled Meds:   stroke: mapping our early stages of recovery book   Does not apply Once   aspirin  325 mg Oral Daily   [START ON 02/21/2021] atorvastatin  80 mg Oral QHS   cholecalciferol  1,000 Units Oral Daily   mouth rinse  15 mL Mouth Rinse BID   sodium chloride flush  3 mL Intravenous Q12H   cyanocobalamin  1,000 mcg Oral Daily   Continuous Infusions: PRN Meds:.acetaminophen, ondansetron **OR** ondansetron  (ZOFRAN) IV  Antimicrobials: Anti-infectives (From admission, onward)    None        I have personally reviewed the following labs and images: CBC: Recent Labs  Lab 02/19/21 0853 02/20/21 0605  WBC 11.2* 9.8  HGB 12.7* 11.9*  HCT 39.0 35.8*  MCV 91.8 90.2  PLT 247 222   BMP &GFR Recent Labs  Lab 02/19/21 0853 02/20/21 0605  NA 138 140  K 4.0 4.6  CL 109 110  CO2 22 24  GLUCOSE 130* 104*  BUN 51* 39*  CREATININE 2.28* 1.91*  CALCIUM 8.9 8.8*   Estimated Creatinine Clearance: 22.3 mL/min (A) (by C-G formula based on SCr of 1.91 mg/dL (H)). Liver & Pancreas: No results for input(s): AST, ALT, ALKPHOS, BILITOT, PROT, ALBUMIN in the last 168 hours. No results for input(s): LIPASE, AMYLASE in the last 168 hours. No results for input(s): AMMONIA in the last 168 hours. Diabetic: No results for input(s): HGBA1C in the last 72 hours. Recent Labs  Lab 02/19/21 1831 02/20/21 0527  GLUCAP 108* 95   Cardiac Enzymes: No results for input(s): CKTOTAL, CKMB,  CKMBINDEX, TROPONINI in the last 168 hours. No results for input(s): PROBNP in the last 8760 hours. Coagulation Profile: No results for input(s): INR, PROTIME in the last 168 hours. Thyroid Function Tests: No results for input(s): TSH, T4TOTAL, FREET4, T3FREE, THYROIDAB in the last 72 hours. Lipid Profile: Recent Labs    02/20/21 0605  CHOL 220*  HDL 51  LDLCALC 159*  TRIG 51  CHOLHDL 4.3   Anemia Panel: No results for input(s): VITAMINB12, FOLATE, FERRITIN, TIBC, IRON, RETICCTPCT in the last 72 hours. Urine analysis:    Component Value Date/Time   COLORURINE YELLOW (A) 02/19/2021 1055   APPEARANCEUR CLEAR (A) 02/19/2021 1055   LABSPEC 1.013 02/19/2021 1055   PHURINE 5.0 02/19/2021 1055   GLUCOSEU NEGATIVE 02/19/2021 1055   HGBUR SMALL (A) 02/19/2021 1055   BILIRUBINUR NEGATIVE 02/19/2021 [place] 02/19/2021 1055   PROTEINUR NEGATIVE 02/19/2021 1055   NITRITE NEGATIVE 02/19/2021  1055   LEUKOCYTESUR NEGATIVE 02/19/2021 1055   Sepsis Labs: Invalid input(s): PROCALCITONIN, [place]  Microbiology: Recent Results (from the past 240 hour(s))  Resp Panel by RT-PCR (Flu A&B, Covid) Nasopharyngeal Swab     Status: None   Collection Time: 02/19/21  8:58 AM   Specimen: Nasopharyngeal Swab; Nasopharyngeal(NP) swabs in vial transport medium  Result Value Ref Range Status   SARS Coronavirus 2 by RT PCR NEGATIVE NEGATIVE Final    Comment: (NOTE) SARS-CoV-2 target nucleic acids are NOT DETECTED.  The SARS-CoV-2 RNA is generally detectable in upper respiratory specimens during the acute phase of infection. The lowest concentration of SARS-CoV-2 viral copies this assay can detect is 138 copies/mL. A negative result does not preclude SARS-Cov-2 infection and should not be used as the sole basis for treatment or other patient management decisions. A negative result may occur with  improper specimen collection/handling, submission of specimen other than nasopharyngeal swab, presence of viral mutation(s) within the areas targeted by this assay, and inadequate number of viral copies(<138 copies/mL). A negative result must be combined with clinical observations, patient history, and epidemiological information. The expected result is Negative.  Fact Sheet for Patients:  EntrepreneurPulse.com.au  Fact Sheet for Healthcare Providers:  IncredibleEmployment.be  This test is no t yet approved or cleared by the [place] FDA and  has been authorized for detection and/or diagnosis of SARS-CoV-2 by FDA under an Emergency Use Authorization (EUA). This EUA will remain  in effect (meaning this test can be used) for the duration of the COVID-19 declaration under Section 564(b)(1) of the Act, 21 U.S.C.section 360bbb-3(b)(1), unless the authorization is terminated  or revoked sooner.       Influenza A by PCR NEGATIVE NEGATIVE Final    Influenza B by PCR NEGATIVE NEGATIVE Final    Comment: (NOTE) The Xpert Xpress SARS-CoV-2/FLU/RSV plus assay is intended as an aid in the diagnosis of influenza from Nasopharyngeal swab specimens and should not be used as a sole basis for treatment. Nasal washings and aspirates are unacceptable for Xpert Xpress SARS-CoV-2/FLU/RSV testing.  Fact Sheet for Patients: EntrepreneurPulse.com.au  Fact Sheet for Healthcare Providers: IncredibleEmployment.be  This test is not yet approved or cleared by the [place] FDA and has been authorized for detection and/or diagnosis of SARS-CoV-2 by FDA under an Emergency Use Authorization (EUA). This EUA will remain in effect (meaning this test can be used) for the duration of the COVID-19 declaration under Section 564(b)(1) of the Act, 21 U.S.C. section 360bbb-3(b)(1), unless the authorization is terminated or revoked.  Performed at [facility], [address], [place], [place] [address]     Radiology Studies: MR ANGIO HEAD WO CONTRAST  Result Date: 02/19/2021 CLINICAL DATA:  Stroke follow-up EXAM: MRA HEAD WITHOUT CONTRAST TECHNIQUE: Angiographic images of the Circle of Willis were acquired using MRA technique without intravenous contrast. COMPARISON:  No pertinent prior exam. FINDINGS: POSTERIOR CIRCULATION: --Vertebral arteries: Poor flow related enhancement of the right V4 segment. Normal left. --Inferior cerebellar arteries: Left dominant PICA. --Basilar artery: Multifocal severe stenosis of the basilar artery. --Superior cerebellar arteries: Normal. --Posterior cerebral arteries: Normal. ANTERIOR CIRCULATION: --Intracranial internal carotid arteries: Irregularity of the right internal carotid artery at the skull base may be artifactual. --Anterior cerebral arteries (ACA): Normal. --Middle cerebral arteries (MCA): Normal. ANATOMIC VARIANTS: Bilateral fetal origins of both posterior cerebral  arteries. IMPRESSION: 1. No emergent large vessel occlusion. 2. Severe stenosis of the basilar artery. 3. Poor flow related enhancement of the right vertebral artery V4 segment, likely due to high-grade stenosis 4. Irregularity of the right internal carotid artery at the skull base, possibly artifactual. Electronically Signed   By: Ulyses Jarred M.D.   On: 02/19/2021 23:09   MR BRAIN WO CONTRAST  Result Date: 02/19/2021 CLINICAL DATA:  Dizziness. EXAM: MRI HEAD WITHOUT CONTRAST TECHNIQUE: Multiplanar, multiecho pulse sequences of the brain and surrounding structures were obtained without intravenous contrast. COMPARISON:  CT head from the same day. FINDINGS: Brain: Acute infarct at the junction of the left middle cerebellar peduncle and pons. Additional punctate infarct in the high right frontoparietal perirolandic white matter. Mild associated edema without mass effect. Additional advanced patchy and confluent T2/FLAIR hyperintensities in the white matter, nonspecific but most likely related to chronic microvascular ischemic disease. Mild to moderate generalized atrophy. Numerous supratentorial small cortical foci of susceptibility artifact throughout the cerebrum which are predominantly located at the gray-white matter junction with a somewhat posterior predominance, compatible with prior microhemorrhages. No evidence of acute hemorrhage. No hydrocephalus, mass lesion, or sizable extra-axial fluid collection. Vascular: Major arterial flow voids are maintained at the skull base. Small vertebrobasilar system, most likely related to bilateral posterior communicating arteries. Skull and upper cervical spine: Normal marrow signal. Sinuses/Orbits: Mild ethmoid air cell and inferior maxillary sinus mucosal thickening. Completely opacified right frontal sinus. Other: No mastoid effusions. IMPRESSION: 1. Acute infarct at the junction of the left middle cerebellar peduncle and pons. Additional punctate infarct in the  high right frontoparietal perirolandic white matter. Mild associated edema without mass effect. Given involvement of different vascular territories, consider embolic etiology. 2. Advanced chronic microvascular ischemic disease and mild to moderate generalized atrophy. 3. Numerous supratentorial microhemorrhages predominantly at the gray-white matter junction, which is suggestive of cerebral amyloid angiopathy. 4. Paranasal sinus disease with completely opacified right frontal sinus. Electronically Signed   By: Margaretha Sheffield MD   On: 02/19/2021 14:21   US Carotid Bilateral  Result Date: 02/20/2021 CLINICAL DATA:  87 year old male with symptoms of stroke EXAM: BILATERAL CAROTID DUPLEX ULTRASOUND TECHNIQUE: Pearline Cables scale imaging, color Doppler and duplex ultrasound were performed of bilateral carotid and vertebral arteries in the neck. COMPARISON:  None. FINDINGS: Criteria: Quantification of carotid stenosis is based on velocity parameters that correlate the residual internal carotid diameter with NASCET-based stenosis levels, using the diameter of the distal internal carotid lumen as the denominator for stenosis measurement. The following velocity measurements were obtained: RIGHT ICA: 94/29 cm/sec CCA: [id number] cm/sec SYSTOLIC ICA/CCA RATIO:  1.1 ECA:  139 cm/sec LEFT ICA: 70/21 cm/sec CCA: [id number] cm/sec SYSTOLIC ICA/CCA RATIO:  1.1 ECA:  69 cm/sec RIGHT CAROTID ARTERY: Heterogeneous atherosclerotic plaque with areas of irregularity throughout the common carotid artery. Plaque extends into the proximal internal carotid artery. By peak systolic velocity criteria, the estimated stenosis is less than 50%. RIGHT VERTEBRAL ARTERY:  Patent with normal antegrade flow. LEFT CAROTID ARTERY: Moderate heterogeneous and irregular atherosclerotic plaque in the distal common carotid artery and proximal internal carotid artery. By peak systolic velocity criteria, the estimated stenosis is less than 50%. LEFT VERTEBRAL ARTERY:   Patent with antegrade flow. IMPRESSION: 1. Mild (1-49%) stenosis proximal right internal carotid artery secondary to heterogeneous and irregular atherosclerotic plaque. 2. Mild (1-49%) stenosis proximal left internal carotid artery secondary to heterogeneous and irregular atherosclerotic plaque. 3. Vertebral arteries are patent with normal antegrade flow. Signed, Criselda Peaches, MD, [facility] Electronically Signed   By: Jacqulynn Cadet M.D.   On: 02/20/2021 08:05   ECHOCARDIOGRAM COMPLETE  Result Date: 02/20/2021    ECHOCARDIOGRAM REPORT   Patient Name:   TREVONNE WINES Date of Exam: 02/20/2021 Medical Rec #:  [id number]     Height:       66.0 in Accession #:    [id number]    Weight:       151.7 lb Date of Birth:  1925-10-18     BSA:          1.778 m Patient Age:    5 years      BP:           122/68 mmHg Patient Gender: M             HR:           85 bpm. Exam Location:  [facility] Procedure: 2D Echo, Cardiac Doppler and Color Doppler Indications:     Syncope R55  History:         Patient has no prior history of Echocardiogram examinations. No                  medical history on file.  Sonographer:     Sherrie Sport RDCS (AE) Referring Phys:  [id number] Royce Macadamia AGBATA Diagnosing Phys: Bartholome Bill MD IMPRESSIONS  1. Left ventricular ejection fraction, by estimation, is 55 to 60%. The left ventricle has normal function. The left ventricle has no regional wall motion abnormalities. There is moderate left ventricular hypertrophy. Left ventricular diastolic parameters are consistent with Grade I diastolic dysfunction (impaired relaxation).  2. Right ventricular systolic function is normal. The right ventricular size is normal.  3. The mitral valve is grossly normal. Trivial mitral valve regurgitation.  4. The aortic valve is grossly normal. Aortic valve regurgitation is trivial. FINDINGS  Left Ventricle: Left ventricular ejection fraction, by estimation, is  55 to 60%. The left ventricle has normal function. The left ventricle has no regional wall motion abnormalities. The left ventricular internal cavity size was normal in size. There is  moderate left ventricular hypertrophy. Left ventricular diastolic parameters are consistent with Grade I diastolic dysfunction (impaired relaxation). Right Ventricle: The right ventricular size is normal. No increase in right ventricular wall thickness. Right ventricular systolic function is normal. Left Atrium: Left atrial size was normal in size. Right Atrium: Right atrial size was normal in size. Pericardium: There is no evidence of pericardial effusion. Mitral Valve: The mitral valve is grossly normal. Trivial mitral valve regurgitation. Tricuspid Valve: The tricuspid valve is grossly normal. Tricuspid valve regurgitation is trivial. Aortic Valve: The aortic valve is grossly  normal. Aortic valve regurgitation is trivial. Aortic valve mean gradient measures 2.0 mmHg. Aortic valve peak gradient measures 3.7 mmHg. Aortic valve area, by VTI measures 2.89 cm. Pulmonic Valve: The pulmonic valve was not well visualized. Pulmonic valve regurgitation is trivial. Aorta: The aortic root is normal in size and structure. IAS/Shunts: The atrial septum is grossly normal.  LEFT VENTRICLE PLAX 2D LVIDd:         3.30 cm  Diastology LVIDs:         2.30 cm  LV e' medial:    10.30 cm/s LV PW:         1.10 cm  LV E/e' medial:  10.5 LV IVS:        1.00 cm  LV e' lateral:   5.33 cm/s LVOT diam:     2.00 cm  LV E/e' lateral: 20.3 LV SV:         48 LV SV Index:   27 LVOT Area:     3.14 cm  RIGHT VENTRICLE RV Basal diam:  2.70 cm RV S prime:     10.60 cm/s TAPSE (M-mode): 3.1 cm LEFT ATRIUM             Index       RIGHT ATRIUM           Index LA diam:        1.70 cm 0.96 cm/m  RA Area:     12.90 cm LA Vol (A2C):   21.5 ml 12.09 ml/m RA Volume:   26.20 ml  14.73 ml/m LA Vol (A4C):   18.7 ml 10.52 ml/m LA Biplane Vol: 20.8 ml 11.70 ml/m  AORTIC VALVE                    PULMONIC VALVE AV Area (Vmax):    2.52 cm    RVOT Peak grad: 2 mmHg AV Area (Vmean):   2.45 cm AV Area (VTI):     2.89 cm AV Vmax:           96.10 cm/s AV Vmean:          73.500 cm/s AV VTI:            0.165 m AV Peak Grad:      3.7 mmHg AV Mean Grad:      2.0 mmHg LVOT Vmax:         77.00 cm/s LVOT Vmean:        57.300 cm/s LVOT VTI:          0.152 m LVOT/AV VTI ratio: 0.92  AORTA Ao Root diam: 3.70 cm MITRAL VALVE                TRICUSPID VALVE MV Area (PHT): 9.14 cm     TR Peak grad:   19.9 mmHg MV Decel Time: 83 msec      TR Vmax:        223.00 cm/s MV E velocity: 108.00 cm/s MV A velocity: 51.80 cm/s   SHUNTS MV E/A ratio:  2.08         Systemic VTI:  0.15 m                             Systemic Diam: 2.00 cm Bartholome Bill MD Electronically signed by Bartholome Bill MD Signature Date/Time: 02/20/2021/10:38:19 AM    Final       Cailen Texeira T. [place]  If 7PM-7AM, please  contact night-coverage [email or web address] 02/20/2021, 12:09 PM

## 2021-02-20 NOTE — Evaluation (Signed)
Physical Therapy Evaluation Patient Details Name: Austin Fleming MRN: [id number] DOB: [birth year] Today's Date: 02/20/2021   History of Present Illness  Austin Fleming is a 87 y.o. male with history of no apparent cardiac abnormalities who was brought to the emergency room after an apparent syncopal episode. Patient was sitting on the edge of his bed and subsequently was found on the floor. Patient is not able to give a history of the event. Patient was apparently nauseated, diaphoretic, and somewhat confused. Subsequent brain MRI revealed an acute infarct at the junction of the left middle cerebellar peduncle and pons. Additional punctate infarct in the high right frontoparietal white matter.  Supratentorial microhemorrhages in the gray-white matter junction suggested possible cerebral amyloid angiopathy. This appeared embolic due to bilateral distribution.    Clinical Impression  Pt received in Semi-Fowler's position and agreeable to therapy.  Pt required significant amount of encouragement in order to participate, along with encouragement from wife.  Pt able to perform bed level exercises, however needs some tactile and minA for mobility in the bed and for transfers.  Pt has significantly decreased strength in the lower extremities, as well as UE's.  When coming upright into standing, pt has muscle trembling in the UE's.  Pt able to perform lateral steps to head of bed, however refused to ambulate any more during today's treatment.  Pt notes he did not receive any sleep last night and has already exercised extensively this AM.  Due to inability to ambulate longer distances and decrease functional strength in standing, recommendation is to SNF at this time.     Follow Up Recommendations SNF    Equipment Recommendations       Recommendations for Other Services       Precautions / Restrictions Precautions Precautions: Fall Restrictions Weight Bearing Restrictions: No      Mobility  Bed  Mobility Overal bed mobility: Needs Assistance Bed Mobility: Sit to Supine;Supine to Sit     Supine to sit: Mod assist Sit to supine: Mod assist   General bed mobility comments: Min A, greatly increased time, verbal and tactile cues required    Transfers Overall transfer level: Needs assistance Equipment used: Rolling walker (2 wheeled) Transfers: Sit to/from Stand Sit to Stand: Mod assist Stand pivot transfers: Mod assist      Lateral/Scoot Transfers: Min assist General transfer comment: ModA for mobility and requires extra time, extensive amounts of cuing and encouragement in order to perform.  Ambulation/Gait Ambulation/Gait assistance: Min guard Gait Distance (Feet): 4 Feet Assistive device: Rolling walker (2 wheeled)   Gait velocity: decreased   General Gait Details: Lateral steps towards head of bed  Stairs            Wheelchair Mobility    Modified Rankin (Stroke Patients Only)       Balance Overall balance assessment: Needs assistance Sitting-balance support: Bilateral upper extremity supported Sitting balance-Leahy Scale: Fair   Postural control: Posterior lean Standing balance support: Bilateral upper extremity supported Standing balance-Leahy Scale: Poor                               Pertinent Vitals/Pain Pain Assessment: No/denies pain    Home Living Family/patient expects to be discharged to:: Private residence Living Arrangements: Children Available Help at Discharge: Family;Available PRN/intermittently Type of Home: House Home Access: Stairs to enter   Entrance Stairs-Number of Steps: 4 Home Layout: One level Home Equipment: Walker - 2  wheels      Prior Function Level of Independence: Needs assistance   Gait / Transfers Assistance Needed: Uses 2W RW  ADL's / Homemaking Assistance Needed: According to pt and daughter, pt is able to function at home with most ADL's, and uses a sink for bathing.        Hand  Dominance   Dominant Hand: Right    Extremity/Trunk Assessment   Upper Extremity Assessment Upper Extremity Assessment: Generalized weakness    Lower Extremity Assessment Lower Extremity Assessment: Generalized weakness       Communication   Communication: Expressive difficulties  Cognition Arousal/Alertness: Awake/alert Behavior During Therapy: WFL for tasks assessed/performed Overall Cognitive Status: Impaired/Different from baseline Area of Impairment: Orientation;Attention;Memory;Awareness                               General Comments: Pt has history of cognitive impairments.  Pt also notes he is fatigued and does not want to move because he has less energy.      General Comments      Exercises Total Joint Exercises Ankle Circles/Pumps: AROM;Strengthening;Both;10 reps;Supine Quad Sets: AROM;Strengthening;Both;10 reps;Supine Gluteal Sets: AROM;Strengthening;Both;10 reps;Supine Towel Squeeze: AROM;Strengthening;Both;10 reps;Supine Heel Slides: AROM;Strengthening;Both;10 reps;Supine Hip ABduction/ADduction: AROM;Strengthening;Both;10 reps;Supine Straight Leg Raises: AROM;Strengthening;Both;10 reps;Supine Marching in Standing: AROM;Strengthening;Both;10 reps;Standing Other Exercises Other Exercises: bed mobility, transfers, toileting, dressing, standing/sitting balance tolerance. Educ re: role of OT, POC, DC recs Other Exercises: Pt educated on roles of therapy and services provided during hospital stay.  Pt also encouraged to perform exercises during hospital stay in order to prevent atrophy of msuculature.   Assessment/Plan    PT Assessment Patient needs continued PT services  PT Problem List Decreased strength;Decreased activity tolerance;Decreased balance       PT Treatment Interventions DME instruction;Gait training;Stair training;Therapeutic activities;Therapeutic exercise;Balance training    PT Goals (Current goals can be found in the Care Plan  section)  Acute Rehab PT Goals Patient Stated Goal: to go home PT Goal Formulation: With patient/family Time For Goal Achievement: 03/06/21 Potential to Achieve Goals: Fair    Frequency Min 2X/week   Barriers to discharge Inaccessible home environment Pt has difficulty with ambulation due to fatigue reported by pt.    Co-evaluation               AM-PAC PT "6 Clicks" Mobility  Outcome Measure Help needed turning from your back to your side while in a flat bed without using bedrails?: A Lot Help needed moving from lying on your back to sitting on the side of a flat bed without using bedrails?: A Lot Help needed moving to and from a bed to a chair (including a wheelchair)?: A Lot Help needed standing up from a chair using your arms (e.g., wheelchair or bedside chair)?: A Little Help needed to walk in hospital room?: A Lot Help needed climbing 3-5 steps with a railing? : Total 6 Click Score: 12    End of Session Equipment Utilized During Treatment: Gait belt Activity Tolerance: Patient tolerated treatment well Patient left: in bed;with call bell/phone within reach;with bed alarm set;with family/visitor present Nurse Communication: Mobility status PT Visit Diagnosis: Unsteadiness on feet (R26.81);Other abnormalities of gait and mobility (R26.89);Muscle weakness (generalized) (M62.81);History of falling (Z91.81);Difficulty in walking, not elsewhere classified (R26.2)    Time: [id number] PT Time Calculation (min) (ACUTE ONLY): 55 min   Charges:   PT Evaluation $PT Eval Low Complexity: 1 Low PT Treatments $Gait Training: 8-22  mins $Therapeutic Exercise: 23-37 mins        Gwenlyn Saran, PT, DPT 02/20/21, 3:20 PM   Christie Nottingham 02/20/2021, 3:12 PM

## 2021-02-21 ENCOUNTER — Encounter: Payer: Self-pay | Admitting: Student

## 2021-02-21 DIAGNOSIS — R55 Syncope and collapse: Secondary | ICD-10-CM

## 2021-02-21 DIAGNOSIS — N184 Chronic kidney disease, stage 4 (severe): Secondary | ICD-10-CM

## 2021-02-21 DIAGNOSIS — Z7189 Other specified counseling: Secondary | ICD-10-CM

## 2021-02-21 DIAGNOSIS — I639 Cerebral infarction, unspecified: Secondary | ICD-10-CM

## 2021-02-21 DIAGNOSIS — Z515 Encounter for palliative care: Secondary | ICD-10-CM

## 2021-02-21 LAB — RENAL FUNCTION PANEL
Albumin: 3.3 g/dL — ABNORMAL LOW (ref 3.5–5.0)
Anion gap: 9 (ref 5–15)
BUN: 32 mg/dL — ABNORMAL HIGH (ref 8–23)
CO2: 24 mmol/L (ref 22–32)
Calcium: 9.1 mg/dL (ref 8.9–10.3)
Chloride: 108 mmol/L (ref 98–111)
Creatinine, Ser: 1.87 mg/dL — ABNORMAL HIGH (ref 0.61–1.24)
GFR, Estimated: 33 mL/min — ABNORMAL LOW (ref >60)
Glucose, Bld: 100 mg/dL — ABNORMAL HIGH (ref 70–99)
Phosphorus: 2.8 mg/dL (ref 2.5–4.6)
Potassium: 4.4 mmol/L (ref 3.5–5.1)
Sodium: 141 mmol/L (ref 135–145)

## 2021-02-21 LAB — HEMOGLOBIN A1C
Hgb A1c MFr Bld: 6.1 % — ABNORMAL HIGH (ref 4.8–5.6)
Mean Plasma Glucose: 128 mg/dL

## 2021-02-21 LAB — IRON AND TIBC
Iron: 67 ug/dL (ref 45–182)
Saturation Ratios: 23 % (ref 17.9–39.5)
TIBC: 290 ug/dL (ref 250–450)
UIBC: 223 ug/dL

## 2021-02-21 LAB — CBC
HCT: 38.3 % — ABNORMAL LOW (ref 39.0–52.0)
Hemoglobin: 12.9 g/dL — ABNORMAL LOW (ref 13.0–17.0)
MCH: 30.5 pg (ref 26.0–34.0)
MCHC: 33.7 g/dL (ref 30.0–36.0)
MCV: 90.5 fL (ref 80.0–100.0)
Platelets: 212 10*3/uL (ref 150–400)
RBC: 4.23 MIL/uL (ref 4.22–5.81)
RDW: 13 % (ref 11.5–15.5)
WBC: 9.8 10*3/uL (ref 4.0–10.5)
nRBC: 0 % (ref 0.0–0.2)

## 2021-02-21 LAB — RETICULOCYTES
Immature Retic Fract: 3.5 % (ref 2.3–15.9)
RBC.: 4.24 MIL/uL (ref 4.22–5.81)
Retic Count, Absolute: 48.8 10*3/uL (ref 19.0–186.0)
Retic Ct Pct: 1.2 % (ref 0.4–3.1)

## 2021-02-21 LAB — VITAMIN B12: Vitamin B-12: 3858 pg/mL — ABNORMAL HIGH (ref 180–914)

## 2021-02-21 LAB — TSH: TSH: 1.586 u[IU]/mL (ref 0.350–4.500)

## 2021-02-21 LAB — FERRITIN: Ferritin: 36 ng/mL (ref 24–336)

## 2021-02-21 LAB — GLUCOSE, CAPILLARY
Glucose-Capillary: 76 mg/dL (ref 70–99)
Glucose-Capillary: 67 mg/dL — ABNORMAL LOW (ref 70–99)
Glucose-Capillary: 92 mg/dL (ref 70–99)

## 2021-02-21 LAB — FOLATE: Folate: 25 ng/mL (ref >5.9)

## 2021-02-21 LAB — MAGNESIUM: Magnesium: 2.1 mg/dL (ref 1.7–2.4)

## 2021-02-21 LAB — AMMONIA: Ammonia: <9 umol/L — ABNORMAL LOW (ref 9–35)

## 2021-02-21 MED ORDER — ATORVASTATIN CALCIUM 20 MG PO TABS
80.0000 mg | ORAL_TABLET | Freq: Every day | ORAL | Status: DC
Start: 2021-02-21 — End: 2022-02-06

## 2021-02-21 NOTE — Consult Note (Signed)
Consultation Note Date: 02/21/2021   Patient Name: Austin Fleming  DOB: [birth year]  MRN: [id number]  Age / Sex: 87 y.o., male  PCP: Austin Ramsay, MD Referring Physician: Geradine Girt, DO  Reason for Consultation: Establishing goals of care and Psychosocial/spiritual support  HPI/Patient Profile: 87 y.o. male  with past medical history of no significant health issues admitted on 02/19/2021 with syncope.   Clinical Assessment and Goals of Care: I have reviewed medical records including EPIC notes, labs and imaging, received report from RN, assessed the patient and then met at the bedside along with Austin Fleming and  to discuss diagnosis prognosis, Austin Fleming, [name], disposition and options.  I introduced Palliative Medicine as specialized medical care for people living with serious illness. It focuses on providing relief from the symptoms and stress of a serious illness. The goal is to improve quality of life for both the patient and the family.  We discussed a brief life review of the patient.  Austin Fleming has been living independently, with some assistance with IADLs.  His daughter, Austin Fleming, and her 2 siblings are available to help as needed.  We talked about Austin Fleming acute health problems.  He seems, overall, stable and pleasant.  Austin Fleming seems realistic about his health concerns and ability to recover.  Their goal is for him to be independent enough to return to his own home with care.    Advanced directives, concepts specific to code status, were considered and discussed.  Austin Fleming unlikely to endorse "treat the treatable, but allowing natural death"/DNR.  Austin Fleming form completed and placed on chart.   Palliative Care services outpatient were explained and offered.  Family is agreeable to outpatient palliative services.  Discussed the importance of continued conversation with family and the medical  providers regarding overall plan of care and treatment options, ensuring decisions are within the context of the patient's values and GOCs.    Questions and concerns were addressed.  The family was encouraged to call with questions or concerns.  PMT will continue to support holistically.  Conference with attending, bedside nursing staff, transition of care team related to patient condition, needs, goals of care, disposition.  HCPOA  NEXT OF KIN - daughter Austin Fleming     SUMMARY OF RECOMMENDATIONS   Continue to treat the treatable but no CPR or intubation Short-term rehab with a goal of returning home Outpatient palliative to follow.  Code Status/Advance Care Planning: DNR  Symptom Management:  Per hospitalist, no additional needs  Palliative Prophylaxis:  Frequent Pain Assessment  Additional Recommendations (Limitations, Scope, Preferences): Treat the treatable  Psycho-social/Spiritual:  Desire for further Chaplaincy support:no Additional Recommendations: Caregiving  Support/Resources and Education on Hospice  Prognosis:  Unable to determine, based on outcomes   Discharge Planning: [place] for rehab with Palliative care service follow-up      Primary Diagnoses: Present on Admission:  AMS (altered mental status)  Syncopal episodes  Dehydration  CKD (chronic kidney disease), stage IV (Austin Fleming)  Acute CVA (  cerebrovascular accident) [facility])   I have reviewed the medical record, interviewed the patient and family, and examined the patient. The following aspects are pertinent.  History reviewed. No pertinent past medical history. Social History   Socioeconomic History   Marital status: Single    Spouse name: Not on file   Number of children: Not on file   Years of education: Not on file   Highest education level: Not on file  Occupational History   Occupation: retired  Tobacco Use   Smoking status: Former    Pack years: 0.00    Passive exposure:  Past   Smokeless tobacco: Former  Scientific laboratory technician Use: Never used  Substance and Sexual Activity   Alcohol use: Never   Drug use: Never   Sexual activity: Not Currently  Other Topics Concern   Not on file  Social History Narrative   Lives with daughter   Social Determinants of Health   Financial Resource Strain: Not on file  Food Insecurity: Not on file  Transportation Needs: Not on file  Physical Activity: Not on file  Stress: Not on file  Social Connections: Not on file   History reviewed. No pertinent family history. Scheduled Meds:   stroke: mapping our early stages of recovery book   Does not apply Once   aspirin  325 mg Oral Daily   atorvastatin  80 mg Oral QHS   cholecalciferol  1,000 Units Oral Daily   mouth rinse  15 mL Mouth Rinse BID   sodium chloride flush  3 mL Intravenous Q12H   cyanocobalamin  1,000 mcg Oral Daily   Continuous Infusions: PRN Meds:.acetaminophen, ondansetron **OR** ondansetron (ZOFRAN) IV Medications Prior to Admission:  Prior to Admission medications   Medication Sig Start Date End Date Taking? Authorizing Provider  acetaminophen (TYLENOL) 500 MG tablet Take 500 mg by mouth every 6 (six) hours as needed.   Yes [provider]  aspirin 325 MG tablet Take 325 mg by mouth daily.   Yes [provider]  Cholecalciferol 25 MCG (1000 UT) capsule Take 1,000 Units by mouth daily.   Yes [provider]  cyanocobalamin 1000 MCG tablet Take 1,000 mcg by mouth daily.   Yes [provider]  Misc Natural Products (OSTEO BI-FLEX ADV TRIPLE ST PO) Take 1 tablet by mouth daily.   Yes [provider]  atorvastatin (LIPITOR) 20 MG tablet Take 4 tablets (80 mg total) by mouth at bedtime. 02/21/21   Austin Girt, DO   No Known Allergies Review of Systems  Unable to perform ROS: Age   Physical Exam Vitals and nursing note reviewed.  Constitutional:      General: He is not in acute distress.    Appearance:  He is not ill-appearing.  HENT:     Head: Normocephalic and atraumatic.     Mouth/Throat:     Mouth: Mucous membranes are moist.  Cardiovascular:     Rate and Rhythm: Normal rate.  Pulmonary:     Effort: Pulmonary effort is normal. No respiratory distress.  Musculoskeletal:        General: No swelling.  Neurological:     Mental Status: He is alert.     Comments: Dementia   Psychiatric:        Mood and Affect: Mood normal.        Behavior: Behavior normal.     Comments: Calm and cooperative, not fearful    Vital Signs: BP (!) 156/84 (BP Location: Right  Arm)   Pulse 73   Temp 97.9 F (36.6 C) (Oral)   Resp (!) 22   Ht '5\' 6"'  (1.676 m)   Wt 71.4 kg   SpO2 98%   BMI 25.41 kg/m  Pain Scale: 0-10   Pain Score: 0-No pain   SpO2: SpO2: 98 % O2 Device:SpO2: 98 % O2 Flow Rate: .   IO: Intake/output summary:  Intake/Output Summary (Last 24 hours) at 02/21/2021 1228 Last data filed at 02/21/2021 1027 Gross per 24 hour  Intake 1602.1 ml  Output 950 ml  Net 652.1 ml    LBM: Last BM Date: 02/20/21 Baseline Weight: Weight: 68.8 kg Most recent weight: Weight: 71.4 kg     Palliative Assessment/Data:   Flowsheet Rows    Flowsheet Row Most Recent Value  Intake Tab   Referral Department Hospitalist  Unit at Time of Referral Med/Surg Unit  Palliative Care Primary Diagnosis Neurology  Date Notified 02/20/21  Palliative Care Type New Palliative care  Reason for referral Clarify Goals of Care  Date of Admission 02/19/21  Date first seen by Palliative Care 02/21/21  # of days Palliative referral response time 1 Day(s)  # of days IP prior to Palliative referral 1  Clinical Assessment   Palliative Performance Scale Score 40%  Pain Max last 24 hours Not able to report  Pain Min Last 24 hours Not able to report  Dyspnea Max Last 24 Hours Not able to report  Dyspnea Min Last 24 hours Not able to report  Psychosocial & Spiritual Assessment   Palliative Care Outcomes         Time In: 1000 Time Out: 1110 Time Total: 70 minutes  Greater than 50%  of this time was spent counseling and coordinating care related to the above assessment and plan.  Signed by: Drue Novel, NP   Please contact Palliative Medicine Team phone at [phone] for questions and concerns.  For individual provider: See Shea Evans

## 2021-02-21 NOTE — NC FL2 (Signed)
[place] LEVEL OF CARE SCREENING TOOL     IDENTIFICATION  Patient Name: Austin Fleming Birthdate: 06-Nov-1925 Sex: male Admission Date (Current Location): 02/19/2021  [facility] and [place] Number:  Engineering geologist and Address:  [facility], [address] [place], [place] [address]      Provider Number: [id number]  Attending Physician Name and Address:  Geradine Girt, DO  Relative Name and Phone Number:       Current Level of Care: Hospital Recommended Level of Care: [place] Prior Approval Number:    Date Approved/Denied:   PASRR Number: [id number]  Discharge Plan: SNF    Current Diagnoses: Patient Active Problem List   Diagnosis Date Noted   AMS (altered mental status) 02/19/2021   Syncopal episodes 02/19/2021   Dehydration 02/19/2021   CKD (chronic kidney disease), stage IV ([place]) 02/19/2021   Acute CVA (cerebrovascular accident) ([place]) 02/19/2021    Orientation RESPIRATION BLADDER Height & Weight     Self, Place, Time, Situation  Normal Incontinent, External catheter Weight: 157 lb 6.5 oz (71.4 kg) Height:  '5\' 6"'$  (167.6 cm)  BEHAVIORAL SYMPTOMS/MOOD NEUROLOGICAL BOWEL NUTRITION STATUS   (None)  (None) Incontinent Diet (Regular diet)  AMBULATORY STATUS COMMUNICATION OF NEEDS Skin   Limited Assist Verbally Normal                       Personal Care Assistance Level of Assistance  Bathing, Feeding, Dressing Bathing Assistance: Limited assistance Feeding assistance: Limited assistance Dressing Assistance: Limited assistance     Functional Limitations Info  Sight, Hearing, Speech Sight Info: Adequate Hearing Info: Adequate Speech Info: Adequate    SPECIAL CARE FACTORS FREQUENCY  PT (By licensed PT), OT (By licensed OT)     PT Frequency: 5 x week OT Frequency: 5 x week            Contractures Contractures Info: Not present    Additional Factors Info  Code Status, Allergies Code  Status Info: Full code Allergies Info: NKDA           Current Medications (02/21/2021):  This is the current hospital active medication list Current Facility-Administered Medications  Medication Dose Route Frequency Provider Last Rate Last Admin    stroke: mapping our early stages of recovery book   Does not apply Once Agbata, Tochukwu, MD       0.9 %  sodium chloride infusion   Intravenous Continuous Mercy Riding, MD   Stopped at 02/21/21 0534   acetaminophen (TYLENOL) tablet 500 mg  500 mg Oral Q6H PRN Agbata, Tochukwu, MD       aspirin tablet 325 mg  325 mg Oral Daily Bhagat, Srishti L, MD   325 mg at 02/21/21 0831   atorvastatin (LIPITOR) tablet 80 mg  80 mg Oral QHS Bhagat, Srishti L, MD       cholecalciferol (VITAMIN D) tablet 1,000 Units  1,000 Units Oral Daily Agbata, Tochukwu, MD   1,000 Units at 02/21/21 0830   MEDLINE mouth rinse  15 mL Mouth Rinse BID Agbata, Tochukwu, MD   15 mL at 02/21/21 0839   ondansetron (ZOFRAN) tablet 4 mg  4 mg Oral Q6H PRN Agbata, Tochukwu, MD       Or   ondansetron (ZOFRAN) injection 4 mg  4 mg Intravenous Q6H PRN Agbata, Tochukwu, MD       sodium chloride flush (NS) 0.9 % injection 3 mL  3 mL Intravenous Q12H  Collier Bullock, MD   3 mL at 02/21/21 0834   vitamin B-12 (CYANOCOBALAMIN) tablet 1,000 mcg  1,000 mcg Oral Daily Agbata, Tochukwu, MD   1,000 mcg at 02/21/21 [id number]     Discharge Medications: Please see discharge summary for a list of discharge medications.  Relevant Imaging Results:  Relevant Lab Results:   Additional Information SS#: [id number]  Candie Chroman, LCSW

## 2021-02-21 NOTE — Progress Notes (Addendum)
VAST consult received to obtain IV access. Upon arrival at bedside, student nurse and teacher at bedside attempting to obtain IV access. 0820 -IV access successful by student/teacher. No further access needed at this time.

## 2021-02-21 NOTE — TOC Progression Note (Addendum)
Transition of Care [facility]) - Progression Note    Patient Details  Name: Austin Fleming MRN: [id number] Date of Birth: [birth year]  Transition of Care [facility] CM/SW Rafael Gonzalez, LCSW Phone Number: 02/21/2021, 11:05 AM  Clinical Narrative:   Patient has orders to discharge to SNF today. Checking to see who accepting Medicare waiver. So far [facility] has said they are accepting it. [facility] has no beds. Left messages for [facility], [facility], and [facility].  1:29 pm: [facility] does not accept waiver. [facility] and [facility] do. Provided bed offer for [facility] to patient and daughter. Daughter will review to see if she wants to accept [place] or wants me to reach out to [facility] or [facility] for potential admission today.  1:59 pm: Daughter is trying to get in touch with her brother regarding bed offers. She said she knows he is familiar with [facility] so she asked to go ahead and have them review. Admissions coordinator is aware.  4:56 pm: Patient not vaccinated so has to go in a private room. Won't know until around 9:30 tomorrow if they will have a bed or not. If not, will pursue [facility]. Daughter aware.  Expected Discharge Plan: [place] Barriers to Discharge: Continued Medical Work up  Expected Discharge Plan and Services Expected Discharge Plan: [place]   Discharge Planning Services: CM Consult Post Acute Care Choice: [place] Living arrangements for the past 2 months: Single Family Home Expected Discharge Date: 02/21/21               DME Arranged: N/A DME Agency: NA                   Social Determinants of Health (SDOH) Interventions    Readmission Risk Interventions No flowsheet data found.

## 2021-02-21 NOTE — Discharge Summary (Addendum)
Physician Discharge Summary  Austin Fleming [id number] DOB: 1926-01-22 DOA: 02/19/2021  PCP: Austin Ramsay, MD  Admit date: 02/19/2021 Discharge date: 02/22/2021  Admitted From: home Discharge disposition: sNF   Recommendations for Outpatient Follow-Up:   Family prefers outpatient follow-up with [facility] clinic neurology, should be given number to call for appointment [phone] Avoid hypotension- BP goal normotensive Outpatient palliative care services   Discharge Diagnosis:   Principal Problem:   Syncopal episodes Active Problems:   AMS (altered mental status)   Dehydration   CKD (chronic kidney disease), stage IV ([place])   Acute CVA (cerebrovascular accident) Austin Fleming)    Discharge Condition: Improved.  Diet recommendation: Regular.  Wound care: None.  Code status: DNR    Hospital Course by Problem:   Acute CVA/severe basilar artery stenosis Per neurology: 87 year old man with past medical history as above presenting with a syncopal episode concerning for brainstem hypoperfusion given posterior circulation distribution strokes and severe basilar artery stenosis.  Unfortunately MRI is also concerning for CAA.  Discussed that diffuse axonal injury secondary to traumatic brain injury could also potentially have a similar pattern of microhemorrhages but family does not think he has had any such event.  Additionally he has been tolerating aspirin 325 mg for years.  Neurology discussed that normally they would choose dual antiplatelet therapy for at least 3 weeks in the setting of his significant basilar artery stenosis however given possibility of CAA this would be high risk and even aspirin alone has risk of hemorrhage in CAA.  In shared decision-making with daughter at bedside, confirmed we will continue aspirin for now, add atorvastatin for his hyperlipidemia, and she will have goals of care discussion with family to discuss what interventions they might want  should he have basilar artery occlusion or lobar hemorrhage in the future.   Recommendations: -Maintain adequate hydration/nutrition to avoid hypotension, goal normotension -Continue aspirin 325 mg daily -Atorvastatin 80 mg nightly -Appreciate PT/OT eval -Family prefers outpatient follow-up with [facility] clinic neurology, should be given number to call for appointment [phone] -Continued goals of care discussion with family on an outpatient basis given high risk of lobar hemorrhage and cerebral amyloid angiopathy as well as high risk of basilar artery occlusion given his severe stenosis  CKD-4/azotemia: Cr slightly higher than baseline likely from dehydration.  Improved with IVF  Sinus pauses/Mobitz type-1: Patient is not on nodal blocking agent or Aricept. -Appreciate input by cardiology. -Avoid nodal blocking agents   Medical Consultants:      Discharge Exam:   Vitals:   02/22/21 1007 02/22/21 1137  BP: (!) 157/79 (!) 142/74  Pulse: (!) 56 72  Resp:  18  Temp:  98.1 F (36.7 C)  SpO2:  97%   Vitals:   02/22/21 0333 02/22/21 0844 02/22/21 1007 02/22/21 1137  BP: (!) 156/86 (!) 161/91 (!) 157/79 (!) 142/74  Pulse: 69 67 (!) 56 72  Resp:  15  18  Temp:  (!) 97.4 F (36.3 C)  98.1 F (36.7 C)  TempSrc:  Oral  Oral  SpO2:  96%  97%  Weight:      Height:        General exam: Appears calm and comfortable.    The results of significant diagnostics from this hospitalization (including imaging, microbiology, ancillary and laboratory) are listed below for reference.     Procedures and Diagnostic Studies:   US Carotid Bilateral  Result Date: 02/20/2021 CLINICAL DATA:  87 year old male with symptoms of stroke  EXAM: BILATERAL CAROTID DUPLEX ULTRASOUND TECHNIQUE: Pearline Cables scale imaging, color Doppler and duplex ultrasound were performed of bilateral carotid and vertebral arteries in the neck. COMPARISON:  None. FINDINGS: Criteria: Quantification of carotid stenosis is  based on velocity parameters that correlate the residual internal carotid diameter with NASCET-based stenosis levels, using the diameter of the distal internal carotid lumen as the denominator for stenosis measurement. The following velocity measurements were obtained: RIGHT ICA: 94/29 cm/sec CCA: [id number] cm/sec SYSTOLIC ICA/CCA RATIO:  1.1 ECA:  139 cm/sec LEFT ICA: 70/21 cm/sec CCA: [id number] cm/sec SYSTOLIC ICA/CCA RATIO:  1.1 ECA:  69 cm/sec RIGHT CAROTID ARTERY: Heterogeneous atherosclerotic plaque with areas of irregularity throughout the common carotid artery. Plaque extends into the proximal internal carotid artery. By peak systolic velocity criteria, the estimated stenosis is less than 50%. RIGHT VERTEBRAL ARTERY:  Patent with normal antegrade flow. LEFT CAROTID ARTERY: Moderate heterogeneous and irregular atherosclerotic plaque in the distal common carotid artery and proximal internal carotid artery. By peak systolic velocity criteria, the estimated stenosis is less than 50%. LEFT VERTEBRAL ARTERY:  Patent with antegrade flow. IMPRESSION: 1. Mild (1-49%) stenosis proximal right internal carotid artery secondary to heterogeneous and irregular atherosclerotic plaque. 2. Mild (1-49%) stenosis proximal left internal carotid artery secondary to heterogeneous and irregular atherosclerotic plaque. 3. Vertebral arteries are patent with normal antegrade flow. Signed, Austin Peaches, MD, [facility] Radiology Electronically Signed   By: Austin Fleming M.D.   On: 02/20/2021 08:05   ECHOCARDIOGRAM COMPLETE  Result Date: 02/20/2021    ECHOCARDIOGRAM REPORT   Patient Name:   Austin Fleming Date of Exam: 02/20/2021 Medical Rec #:  [id number]     Height:       66.0 in Accession #:    [id number]    Weight:       151.7 lb Date of Birth:  08/08/1926     BSA:          1.778 m Patient Age:    76 years      BP:           122/68 mmHg Patient Gender: M             HR:            85 bpm. Exam Location:  [facility] Procedure: 2D Echo, Cardiac Doppler and Color Doppler Indications:     Syncope R55  History:         Patient has no prior history of Echocardiogram examinations. No                  medical history on file.  Sonographer:     Sherrie Sport RDCS (AE) Referring Phys:  [id number] Royce Macadamia AGBATA Diagnosing Phys: Bartholome Bill MD IMPRESSIONS  1. Left ventricular ejection fraction, by estimation, is 55 to 60%. The left ventricle has normal function. The left ventricle has no regional wall motion abnormalities. There is moderate left ventricular hypertrophy. Left ventricular diastolic parameters are consistent with Grade I diastolic dysfunction (impaired relaxation).  2. Right ventricular systolic function is normal. The right ventricular size is normal.  3. The mitral valve is grossly normal. Trivial mitral valve regurgitation.  4. The aortic valve is grossly normal. Aortic valve regurgitation is trivial. FINDINGS  Left Ventricle: Left ventricular ejection fraction, by estimation, is 55 to 60%. The left ventricle has normal function. The left ventricle has no regional wall motion abnormalities. The left ventricular internal cavity size was normal in size. There  is  moderate left ventricular hypertrophy. Left ventricular diastolic parameters are consistent with Grade I diastolic dysfunction (impaired relaxation). Right Ventricle: The right ventricular size is normal. No increase in right ventricular wall thickness. Right ventricular systolic function is normal. Left Atrium: Left atrial size was normal in size. Right Atrium: Right atrial size was normal in size. Pericardium: There is no evidence of pericardial effusion. Mitral Valve: The mitral valve is grossly normal. Trivial mitral valve regurgitation. Tricuspid Valve: The tricuspid valve is grossly normal. Tricuspid valve regurgitation is trivial. Aortic Valve: The aortic valve is grossly normal. Aortic valve regurgitation is trivial. Aortic  valve mean gradient measures 2.0 mmHg. Aortic valve peak gradient measures 3.7 mmHg. Aortic valve area, by VTI measures 2.89 cm. Pulmonic Valve: The pulmonic valve was not well visualized. Pulmonic valve regurgitation is trivial. Aorta: The aortic root is normal in size and structure. IAS/Shunts: The atrial septum is grossly normal.  LEFT VENTRICLE PLAX 2D LVIDd:         3.30 cm  Diastology LVIDs:         2.30 cm  LV e' medial:    10.30 cm/s LV PW:         1.10 cm  LV E/e' medial:  10.5 LV IVS:        1.00 cm  LV e' lateral:   5.33 cm/s LVOT diam:     2.00 cm  LV E/e' lateral: 20.3 LV SV:         48 LV SV Index:   27 LVOT Area:     3.14 cm  RIGHT VENTRICLE RV Basal diam:  2.70 cm RV S prime:     10.60 cm/s TAPSE (M-mode): 3.1 cm LEFT ATRIUM             Index       RIGHT ATRIUM           Index LA diam:        1.70 cm 0.96 cm/m  RA Area:     12.90 cm LA Vol (A2C):   21.5 ml 12.09 ml/m RA Volume:   26.20 ml  14.73 ml/m LA Vol (A4C):   18.7 ml 10.52 ml/m LA Biplane Vol: 20.8 ml 11.70 ml/m  AORTIC VALVE                   PULMONIC VALVE AV Area (Vmax):    2.52 cm    RVOT Peak grad: 2 mmHg AV Area (Vmean):   2.45 cm AV Area (VTI):     2.89 cm AV Vmax:           96.10 cm/s AV Vmean:          73.500 cm/s AV VTI:            0.165 m AV Peak Grad:      3.7 mmHg AV Mean Grad:      2.0 mmHg LVOT Vmax:         77.00 cm/s LVOT Vmean:        57.300 cm/s LVOT VTI:          0.152 m LVOT/AV VTI ratio: 0.92  AORTA Ao Root diam: 3.70 cm MITRAL VALVE                TRICUSPID VALVE MV Area (PHT): 9.14 cm     TR Peak grad:   19.9 mmHg MV Decel Time: 83 msec      TR Vmax:  223.00 cm/s MV E velocity: 108.00 cm/s MV A velocity: 51.80 cm/s   SHUNTS MV E/A ratio:  2.08         Systemic VTI:  0.15 m                             Systemic Diam: 2.00 cm Bartholome Bill MD Electronically signed by Bartholome Bill MD Signature Date/Time: 02/20/2021/10:38:19 AM    Final      Labs:   Basic Metabolic Panel: Recent Labs  Lab  02/19/21 0853 02/20/21 0605 02/21/21 0604  NA 138 140 141  K 4.0 4.6 4.4  CL 109 110 108  CO2 '22 24 24  '$ GLUCOSE 130* 104* 100*  BUN 51* 39* 32*  CREATININE 2.28* 1.91* 1.87*  CALCIUM 8.9 8.8* 9.1  MG  --   --  2.1  PHOS  --   --  2.8   GFR Estimated Creatinine Clearance: 22.7 mL/min (A) (by C-G formula based on SCr of 1.87 mg/dL (H)). Liver Function Tests: Recent Labs  Lab 02/21/21 0604  ALBUMIN 3.3*   No results for input(s): LIPASE, AMYLASE in the last 168 hours. Recent Labs  Lab 02/21/21 0604  AMMONIA <9*   Coagulation profile No results for input(s): INR, PROTIME in the last 168 hours.  CBC: Recent Labs  Lab 02/19/21 0853 02/20/21 0605 02/21/21 0604  WBC 11.2* 9.8 9.8  HGB 12.7* 11.9* 12.9*  HCT 39.0 35.8* 38.3*  MCV 91.8 90.2 90.5  PLT 247 222 212   Cardiac Enzymes: No results for input(s): CKTOTAL, CKMB, CKMBINDEX, TROPONINI in the last 168 hours. BNP: Invalid input(s): POCBNP CBG: Recent Labs  Lab 02/20/21 0527 02/21/21 0609 02/21/21 0900 02/21/21 1004 02/22/21 0531  GLUCAP 95 76 67* 92 89   D-Dimer No results for input(s): DDIMER in the last 72 hours. Hgb A1c Recent Labs    02/20/21 0605  HGBA1C 6.1*   Lipid Profile Recent Labs    02/20/21 0605  CHOL 220*  HDL 51  LDLCALC 159*  TRIG 51  CHOLHDL 4.3   Thyroid function studies Recent Labs    02/21/21 0604  TSH 1.586   Anemia work up Recent Labs    02/21/21 0604  VITAMINB12 3,858*  FOLATE 25.0  FERRITIN 36  TIBC 290  IRON 67  RETICCTPCT 1.2   Microbiology Recent Results (from the past 240 hour(s))  Resp Panel by RT-PCR (Flu A&B, Covid) Nasopharyngeal Swab     Status: None   Collection Time: 02/19/21  8:58 AM   Specimen: Nasopharyngeal Swab; Nasopharyngeal(NP) swabs in vial transport medium  Result Value Ref Range Status   SARS Coronavirus 2 by RT PCR NEGATIVE NEGATIVE Final    Comment: (NOTE) SARS-CoV-2 target nucleic acids are NOT DETECTED.  The SARS-CoV-2  RNA is generally detectable in upper respiratory specimens during the acute phase of infection. The lowest concentration of SARS-CoV-2 viral copies this assay can detect is 138 copies/mL. A negative result does not preclude SARS-Cov-2 infection and should not be used as the sole basis for treatment or other patient management decisions. A negative result may occur with  improper specimen collection/handling, submission of specimen other than nasopharyngeal swab, presence of viral mutation(s) within the areas targeted by this assay, and inadequate number of viral copies(<138 copies/mL). A negative result must be combined with clinical observations, patient history, and epidemiological information. The expected result is Negative.  Fact Sheet for Patients:  EntrepreneurPulse.com.au  Fact Sheet  for Healthcare Providers:  IncredibleEmployment.be  This test is no t yet approved or cleared by the [place] and  has been authorized for detection and/or diagnosis of SARS-CoV-2 by FDA under an Emergency Use Authorization (EUA). This EUA will remain  in effect (meaning this test can be used) for the duration of the COVID-19 declaration under Section 564(b)(1) of the Act, 21 U.S.C.section 360bbb-3(b)(1), unless the authorization is terminated  or revoked sooner.       Influenza A by PCR NEGATIVE NEGATIVE Final   Influenza B by PCR NEGATIVE NEGATIVE Final    Comment: (NOTE) The Xpert Xpress SARS-CoV-2/FLU/RSV plus assay is intended as an aid in the diagnosis of influenza from Nasopharyngeal swab specimens and should not be used as a sole basis for treatment. Nasal washings and aspirates are unacceptable for Xpert Xpress SARS-CoV-2/FLU/RSV testing.  Fact Sheet for Patients: EntrepreneurPulse.com.au  Fact Sheet for Healthcare Providers: IncredibleEmployment.be  This test is not yet approved or cleared by the  [place] FDA and has been authorized for detection and/or diagnosis of SARS-CoV-2 by FDA under an Emergency Use Authorization (EUA). This EUA will remain in effect (meaning this test can be used) for the duration of the COVID-19 declaration under Section 564(b)(1) of the Act, 21 U.S.C. section 360bbb-3(b)(1), unless the authorization is terminated or revoked.  Performed at [facility], [address] [place], [place] [address]      Discharge Instructions:   Discharge Instructions     Diet general   Complete by: As directed    Increase activity slowly   Complete by: As directed       Allergies as of 02/22/2021   No Known Allergies      Medication List     TAKE these medications    acetaminophen 500 MG tablet Commonly known as: TYLENOL Take 500 mg by mouth every 6 (six) hours as needed.   aspirin 325 MG tablet Take 325 mg by mouth daily.   atorvastatin 20 MG tablet Commonly known as: LIPITOR Take 4 tablets (80 mg total) by mouth at bedtime.   Cholecalciferol 25 MCG (1000 UT) capsule Take 1,000 Units by mouth daily.   cyanocobalamin 1000 MCG tablet Take 1,000 mcg by mouth daily.   OSTEO BI-FLEX ADV TRIPLE ST PO Take 1 tablet by mouth daily.           Time coordinating discharge: 35 min  Signed:  Geradine Girt DO  [facility] 02/22/2021, 12:31 PM

## 2021-02-21 NOTE — Progress Notes (Signed)
I have reviewed and concur with the nursing student's assessments and documentation.  Sandria Senter, RN, Nursing Instructor

## 2021-02-21 NOTE — Evaluation (Signed)
Speech Language Pathology Evaluation Patient Details Name: Austin Fleming MRN: [id number] DOB: 1925-10-15 Today's Date: 02/21/2021 Time: [id number] SLP Time Calculation (min) (ACUTE ONLY): 26 min  Problem List:  Patient Active Problem List   Diagnosis Date Noted   AMS (altered mental status) 02/19/2021   Syncopal episodes 02/19/2021   Dehydration 02/19/2021   CKD (chronic kidney disease), stage IV ([place]) 02/19/2021   Acute CVA (cerebrovascular accident) ([place]) 02/19/2021   Past Medical History: History reviewed. No pertinent past medical history. Past Surgical History: History reviewed. No pertinent surgical history. HPI:  Austin Fleming is a 87 y.o. male with history of no apparent cardiac abnormalities who was brought to the emergency room after an apparent syncopal episode. Patient was sitting on the edge of his bed and subsequently was found on the floor. Patient is not able to give a history of the event. Patient was apparently nauseated, diaphoretic, and somewhat confused. Subsequent brain MRI revealed an acute infarct at the junction of the left middle cerebellar peduncle and pons. Additional punctate infarct in the high right frontoparietal white matter.  Supratentorial microhemorrhages in the gray-white matter junction suggested possible cerebral amyloid angiopathy. This appeared embolic due to bilateral distribution.   Assessment / Plan / Recommendation Clinical Impression  Pt's daughter present during evaluation. Pt appears much improved over previous day. Currently he is oirented to self, month, year, town and able to state Austin Fleming when given a choice of 2. Pt answers questions about his current living situation but he does have a delayed response. Pt's daughter states she is pleased with his cognitive progress and voices desire for pt to receive therapy at SNF to regain ability to walk. Education was provided to pt's daughter and nursing student on ways to facilitate cognition and create  familiarity. All questions were answered to pt and daughter's satisfaction. At this time, recommend a full cognitive evluation at next venue of care in preparation for safet discharge home after completion of therapy services. Daughter is in agreement with this plan.    SLP Assessment  SLP Recommendation/Assessment: All further Speech Lanaguage Pathology  needs can be addressed in the next venue of care SLP Visit Diagnosis: Cognitive communication deficit (R41.841)    Follow Up Recommendations  Skilled Nursing facility;24 hour supervision/assistance    Frequency and Duration           SLP Evaluation Cognition  Overall Cognitive Status: Within Functional Limits for tasks assessed Arousal/Alertness: Awake/alert Orientation Level: Oriented X4       Comprehension  Auditory Comprehension Overall Auditory Comprehension: Appears within functional limits for tasks assessed    Expression Expression Primary Mode of Expression: Verbal Verbal Expression Overall Verbal Expression: Appears within functional limits for tasks assessed Written Expression Dominant Hand: Right   Oral / Motor  Oral Motor/Sensory Function Overall Oral Motor/Sensory Function: Within functional limits Motor Speech Overall Motor Speech: Appears within functional limits for tasks assessed   GO                   Austin Fleming Austin Fleming M.S., CCC-SLP, [place] Office [phone]  Austin Fleming 02/21/2021, 9:16 AM

## 2021-02-22 LAB — GLUCOSE, CAPILLARY: Glucose-Capillary: 89 mg/dL (ref 70–99)

## 2021-02-22 LAB — RESP PANEL BY RT-PCR (FLU A&B, COVID) ARPGX2
Influenza A by PCR: NEGATIVE
Influenza B by PCR: NEGATIVE
SARS Coronavirus 2 by RT PCR: NEGATIVE

## 2021-02-22 MED ORDER — LABETALOL HCL 5 MG/ML IV SOLN
20.0000 mg | INTRAVENOUS | Status: DC | PRN
  Administered 2021-02-22: 20 mg via INTRAVENOUS
  Filled 2021-02-22 (×2): qty 4

## 2021-02-22 NOTE — Progress Notes (Addendum)
Patients BP is 164/96. Pt is asyptomatic. MD on call notified. Re-checked BP 153/94. Labelatol prn not administered.

## 2021-02-22 NOTE — TOC Progression Note (Signed)
Transition of Care [facility]) - Progression Note    Patient Details  Name: GEAROLD PIER MRN: [id number] Date of Birth: 1926-04-19  Transition of Care [facility]) CM/SW [place], RN Phone Number: 02/22/2021, 12:22 PM  Clinical Narrative:   Hulen Skains Daughter, Ms. Corbett informed her that I spoke with [facility]missions Coordinator and there is no bed available today, should have a bed tomorrow. Daughter also wanted me to send request to [facility], location is better, electronic request sent and phone call message done.    Expected Discharge Plan: [place] Barriers to Discharge: Continued Medical Work up  Expected Discharge Plan and Services Expected Discharge Plan: [place]   Discharge Planning Services: CM Consult Post Acute Care Choice: [place] Living arrangements for the past 2 months: Single Family Home Expected Discharge Date: 02/22/21               DME Arranged: N/A DME Agency: NA                   Social Determinants of Health (SDOH) Interventions    Readmission Risk Interventions No flowsheet data found.

## 2021-02-22 NOTE — Progress Notes (Signed)
Patient Name: Austin Fleming Date of Encounter: 02/22/2021  Hospital Problem List     Principal Problem:   Syncopal episodes Active Problems:   AMS (altered mental status)   Dehydration   CKD (chronic kidney disease), stage IV (HCC)   Acute CVA (cerebrovascular accident) [facility])    Patient Profile      87 y.o. male with history of no apparent cardiac abnormalities who was brought to the emergency room after an apparent syncopal episode.  Apparently the patient was sitting on the edge of his bed and subsequently was found on the floor.  Patient is not able to give a history of the event.  Patient was apparently nauseated and diaphoretic at the time.  He was somewhat confused.  Creatinine was 2.28 with a baseline of 1.97.  His troponin was 12.  Chest x-ray showed no acute cardiopulmonary disease.  Head CT showed no acute abnormalities.  EKG showed sinus rhythm with occasional sinus pause.  No prolonged pauses.  No tachyarrhythmias.  Electrolytes were normal.  Subsequent brain MRI revealed an acute infarct at the junction of the left middle cerebellar peduncle and pons.  Additional punctate infarct in the high right frontoparietal white matter.  Supratentorial microhemorrhages in the gray-white matter junction suggested possible cerebral amyloid angiopathy.  This appeared embolic due to bilateral distribution.  Will follow neurology's guidance regarding further therapy be given microangiopathy, avoiding anticoagulation appears appropriate.  This would include antiplatelet therapy.  Subjective   Difficult historian  Inpatient Medications      stroke: mapping our early stages of recovery book   Does not apply Once   aspirin  325 mg Oral Daily   atorvastatin  80 mg Oral QHS   cholecalciferol  1,000 Units Oral Daily   mouth rinse  15 mL Mouth Rinse BID   sodium chloride flush  3 mL Intravenous Q12H    Vital Signs    Vitals:   02/22/21 0844 02/22/21 1007 02/22/21 1137 02/22/21 1540  BP:  (!) 161/91 (!) 157/79 (!) 142/74 (!) 145/85  Pulse: 67 (!) 56 72 69  Resp: '15  18 16  '$ Temp: (!) 97.4 F (36.3 C)  98.1 F (36.7 C) 97.6 F (36.4 C)  TempSrc: Oral  Oral Oral  SpO2: 96%  97% 98%  Weight:      Height:        Intake/Output Summary (Last 24 hours) at 02/22/2021 1654 Last data filed at 02/22/2021 1423 Gross per 24 hour  Intake 840 ml  Output 500 ml  Net 340 ml   Filed Weights   02/19/21 0843 02/21/21 0500  Weight: 68.8 kg 71.4 kg    Physical Exam    GEN: Chronically ill-appearing male HEENT: normal.  Neck: Supple, no JVD, carotid bruits, or masses. Cardiac: RRR, no murmurs, rubs, or gallops. No clubbing, cyanosis, edema.  Radials/DP/PT 2+ and equal bilaterally.  Respiratory:  Respirations regular and unlabored, clear to auscultation bilaterally. GI: Soft, nontender, nondistended, BS + x 4. MS: no deformity or atrophy. Skin: warm and dry, no rash. Neuro: Limited history  Labs    CBC Recent Labs    02/20/21 0605 02/21/21 0604  WBC 9.8 9.8  HGB 11.9* 12.9*  HCT 35.8* 38.3*  MCV 90.2 90.5  PLT 222 [id number]   Basic Metabolic Panel Recent Labs    02/20/21 0605 02/21/21 0604  NA 140 141  K 4.6 4.4  CL 110 108  CO2 24 24  GLUCOSE 104* 100*  BUN 39* 32*  CREATININE 1.91* 1.87*  CALCIUM 8.8* 9.1  MG  --  2.1  PHOS  --  2.8   Liver Function Tests Recent Labs    02/21/21 0604  ALBUMIN 3.3*   No results for input(s): LIPASE, AMYLASE in the last 72 hours. Cardiac Enzymes No results for input(s): CKTOTAL, CKMB, CKMBINDEX, TROPONINI in the last 72 hours. BNP No results for input(s): BNP in the last 72 hours. D-Dimer No results for input(s): DDIMER in the last 72 hours. Hemoglobin A1C Recent Labs    02/20/21 0605  HGBA1C 6.1*   Fasting Lipid Panel Recent Labs    02/20/21 0605  CHOL 220*  HDL 51  LDLCALC 159*  TRIG 51  CHOLHDL 4.3   Thyroid Function Tests Recent Labs    02/21/21 0604  TSH 1.586    Telemetry    Normal sinus  rhythm with occasional sinus pause  ECG    Normal sinus rhythm  Radiology    DG Chest 2 View  Result Date: 02/19/2021 CLINICAL DATA:  Weakness and confusion EXAM: CHEST - 2 VIEW COMPARISON:  None. FINDINGS: Normal heart size. Aortic tortuosity. There is no edema, consolidation, effusion, or pneumothorax. Artifact from EKG leads IMPRESSION: No evidence of active disease. Electronically Signed   By: Monte Fantasia M.D.   On: 02/19/2021 09:24   CT HEAD WO CONTRAST  Result Date: 02/19/2021 CLINICAL DATA:  Head trauma, minor. EXAM: CT HEAD WITHOUT CONTRAST TECHNIQUE: Contiguous axial images were obtained from the base of the skull through the vertex without intravenous contrast. COMPARISON:  CT head/maxillofacial 03/03/2020. FINDINGS: Brain: Mild-to-moderate cerebral atrophy. Advanced patchy and ill-defined hypoattenuation within the cerebral white matter, nonspecific but compatible with chronic small vessel ischemic disease. There is no acute intracranial hemorrhage. No demarcated cortical infarct. No extra-axial fluid collection. No evidence of intracranial mass. No midline shift. Vascular: No hyperdense vessel.  Atherosclerotic calcifications. Skull: Normal. Negative for fracture or focal lesion. Sinuses/Orbits: Visualized orbits show no acute finding. Redemonstrated complete opacification of the right frontal sinus. Scattered partial opacification of the bilateral ethmoid air cells. Frothy secretions within the left sphenoid sinus. Mild mucosal thickening and mucous retention cysts within the partially imaged right maxillary sinus. IMPRESSION: No evidence of acute intracranial abnormality. Mild-to-moderate cerebral atrophy and advanced cerebral white matter chronic small vessel ischemic disease. Paranasal sinus disease, as described. Electronically Signed   By: Kellie Simmering DO   On: 02/19/2021 10:01   MR ANGIO HEAD WO CONTRAST  Result Date: 02/19/2021 CLINICAL DATA:  Stroke follow-up EXAM: MRA HEAD  WITHOUT CONTRAST TECHNIQUE: Angiographic images of the Circle of Willis were acquired using MRA technique without intravenous contrast. COMPARISON:  No pertinent prior exam. FINDINGS: POSTERIOR CIRCULATION: --Vertebral arteries: Poor flow related enhancement of the right V4 segment. Normal left. --Inferior cerebellar arteries: Left dominant PICA. --Basilar artery: Multifocal severe stenosis of the basilar artery. --Superior cerebellar arteries: Normal. --Posterior cerebral arteries: Normal. ANTERIOR CIRCULATION: --Intracranial internal carotid arteries: Irregularity of the right internal carotid artery at the skull base may be artifactual. --Anterior cerebral arteries (ACA): Normal. --Middle cerebral arteries (MCA): Normal. ANATOMIC VARIANTS: Bilateral fetal origins of both posterior cerebral arteries. IMPRESSION: 1. No emergent large vessel occlusion. 2. Severe stenosis of the basilar artery. 3. Poor flow related enhancement of the right vertebral artery V4 segment, likely due to high-grade stenosis 4. Irregularity of the right internal carotid artery at the skull base, possibly artifactual. Electronically Signed   By: Ulyses Jarred M.D.   On: 02/19/2021 23:09   MR  BRAIN WO CONTRAST  Result Date: 02/19/2021 CLINICAL DATA:  Dizziness. EXAM: MRI HEAD WITHOUT CONTRAST TECHNIQUE: Multiplanar, multiecho pulse sequences of the brain and surrounding structures were obtained without intravenous contrast. COMPARISON:  CT head from the same day. FINDINGS: Brain: Acute infarct at the junction of the left middle cerebellar peduncle and pons. Additional punctate infarct in the high right frontoparietal perirolandic white matter. Mild associated edema without mass effect. Additional advanced patchy and confluent T2/FLAIR hyperintensities in the white matter, nonspecific but most likely related to chronic microvascular ischemic disease. Mild to moderate generalized atrophy. Numerous supratentorial small cortical foci of  susceptibility artifact throughout the cerebrum which are predominantly located at the gray-white matter junction with a somewhat posterior predominance, compatible with prior microhemorrhages. No evidence of acute hemorrhage. No hydrocephalus, mass lesion, or sizable extra-axial fluid collection. Vascular: Major arterial flow voids are maintained at the skull base. Small vertebrobasilar system, most likely related to bilateral posterior communicating arteries. Skull and upper cervical spine: Normal marrow signal. Sinuses/Orbits: Mild ethmoid air cell and inferior maxillary sinus mucosal thickening. Completely opacified right frontal sinus. Other: No mastoid effusions. IMPRESSION: 1. Acute infarct at the junction of the left middle cerebellar peduncle and pons. Additional punctate infarct in the high right frontoparietal perirolandic white matter. Mild associated edema without mass effect. Given involvement of different vascular territories, consider embolic etiology. 2. Advanced chronic microvascular ischemic disease and mild to moderate generalized atrophy. 3. Numerous supratentorial microhemorrhages predominantly at the gray-white matter junction, which is suggestive of cerebral amyloid angiopathy. 4. Paranasal sinus disease with completely opacified right frontal sinus. Electronically Signed   By: Margaretha Sheffield MD   On: 02/19/2021 14:21   US Carotid Bilateral  Result Date: 02/20/2021 CLINICAL DATA:  87 year old male with symptoms of stroke EXAM: BILATERAL CAROTID DUPLEX ULTRASOUND TECHNIQUE: Pearline Cables scale imaging, color Doppler and duplex ultrasound were performed of bilateral carotid and vertebral arteries in the neck. COMPARISON:  None. FINDINGS: Criteria: Quantification of carotid stenosis is based on velocity parameters that correlate the residual internal carotid diameter with NASCET-based stenosis levels, using the diameter of the distal internal carotid lumen as the denominator for stenosis  measurement. The following velocity measurements were obtained: RIGHT ICA: 94/29 cm/sec CCA: [id number] cm/sec SYSTOLIC ICA/CCA RATIO:  1.1 ECA:  139 cm/sec LEFT ICA: 70/21 cm/sec CCA: [id number] cm/sec SYSTOLIC ICA/CCA RATIO:  1.1 ECA:  69 cm/sec RIGHT CAROTID ARTERY: Heterogeneous atherosclerotic plaque with areas of irregularity throughout the common carotid artery. Plaque extends into the proximal internal carotid artery. By peak systolic velocity criteria, the estimated stenosis is less than 50%. RIGHT VERTEBRAL ARTERY:  Patent with normal antegrade flow. LEFT CAROTID ARTERY: Moderate heterogeneous and irregular atherosclerotic plaque in the distal common carotid artery and proximal internal carotid artery. By peak systolic velocity criteria, the estimated stenosis is less than 50%. LEFT VERTEBRAL ARTERY:  Patent with antegrade flow. IMPRESSION: 1. Mild (1-49%) stenosis proximal right internal carotid artery secondary to heterogeneous and irregular atherosclerotic plaque. 2. Mild (1-49%) stenosis proximal left internal carotid artery secondary to heterogeneous and irregular atherosclerotic plaque. 3. Vertebral arteries are patent with normal antegrade flow. Signed, Criselda Peaches, MD, [facility] Radiology Electronically Signed   By: Jacqulynn Cadet M.D.   On: 02/20/2021 08:05   ECHOCARDIOGRAM COMPLETE  Result Date: 02/20/2021    ECHOCARDIOGRAM REPORT   Patient Name:   Austin Fleming Date of Exam: 02/20/2021 Medical Rec #:  [id number]     Height:  66.0 in Accession #:    [id number]    Weight:       151.7 lb Date of Birth:  10-16-1925     BSA:          1.778 m Patient Age:    63 years      BP:           122/68 mmHg Patient Gender: M             HR:           85 bpm. Exam Location:  [facility] Procedure: 2D Echo, Cardiac Doppler and Color Doppler Indications:     Syncope R55  History:         Patient has no prior history of Echocardiogram examinations. No                   medical history on file.  Sonographer:     Sherrie Sport RDCS (AE) Referring Phys:  [id number] Royce Macadamia AGBATA Diagnosing Phys: Bartholome Bill MD IMPRESSIONS  1. Left ventricular ejection fraction, by estimation, is 55 to 60%. The left ventricle has normal function. The left ventricle has no regional wall motion abnormalities. There is moderate left ventricular hypertrophy. Left ventricular diastolic parameters are consistent with Grade I diastolic dysfunction (impaired relaxation).  2. Right ventricular systolic function is normal. The right ventricular size is normal.  3. The mitral valve is grossly normal. Trivial mitral valve regurgitation.  4. The aortic valve is grossly normal. Aortic valve regurgitation is trivial. FINDINGS  Left Ventricle: Left ventricular ejection fraction, by estimation, is 55 to 60%. The left ventricle has normal function. The left ventricle has no regional wall motion abnormalities. The left ventricular internal cavity size was normal in size. There is  moderate left ventricular hypertrophy. Left ventricular diastolic parameters are consistent with Grade I diastolic dysfunction (impaired relaxation). Right Ventricle: The right ventricular size is normal. No increase in right ventricular wall thickness. Right ventricular systolic function is normal. Left Atrium: Left atrial size was normal in size. Right Atrium: Right atrial size was normal in size. Pericardium: There is no evidence of pericardial effusion. Mitral Valve: The mitral valve is grossly normal. Trivial mitral valve regurgitation. Tricuspid Valve: The tricuspid valve is grossly normal. Tricuspid valve regurgitation is trivial. Aortic Valve: The aortic valve is grossly normal. Aortic valve regurgitation is trivial. Aortic valve mean gradient measures 2.0 mmHg. Aortic valve peak gradient measures 3.7 mmHg. Aortic valve area, by VTI measures 2.89 cm. Pulmonic Valve: The pulmonic valve was not well visualized. Pulmonic valve  regurgitation is trivial. Aorta: The aortic root is normal in size and structure. IAS/Shunts: The atrial septum is grossly normal.  LEFT VENTRICLE PLAX 2D LVIDd:         3.30 cm  Diastology LVIDs:         2.30 cm  LV e' medial:    10.30 cm/s LV PW:         1.10 cm  LV E/e' medial:  10.5 LV IVS:        1.00 cm  LV e' lateral:   5.33 cm/s LVOT diam:     2.00 cm  LV E/e' lateral: 20.3 LV SV:         48 LV SV Index:   27 LVOT Area:     3.14 cm  RIGHT VENTRICLE RV Basal diam:  2.70 cm RV S prime:     10.60 cm/s TAPSE (M-mode): 3.1 cm LEFT ATRIUM  Index       RIGHT ATRIUM           Index LA diam:        1.70 cm 0.96 cm/m  RA Area:     12.90 cm LA Vol (A2C):   21.5 ml 12.09 ml/m RA Volume:   26.20 ml  14.73 ml/m LA Vol (A4C):   18.7 ml 10.52 ml/m LA Biplane Vol: 20.8 ml 11.70 ml/m  AORTIC VALVE                   PULMONIC VALVE AV Area (Vmax):    2.52 cm    RVOT Peak grad: 2 mmHg AV Area (Vmean):   2.45 cm AV Area (VTI):     2.89 cm AV Vmax:           96.10 cm/s AV Vmean:          73.500 cm/s AV VTI:            0.165 m AV Peak Grad:      3.7 mmHg AV Mean Grad:      2.0 mmHg LVOT Vmax:         77.00 cm/s LVOT Vmean:        57.300 cm/s LVOT VTI:          0.152 m LVOT/AV VTI ratio: 0.92  AORTA Ao Root diam: 3.70 cm MITRAL VALVE                TRICUSPID VALVE MV Area (PHT): 9.14 cm     TR Peak grad:   19.9 mmHg MV Decel Time: 83 msec      TR Vmax:        223.00 cm/s MV E velocity: 108.00 cm/s MV A velocity: 51.80 cm/s   SHUNTS MV E/A ratio:  2.08         Systemic VTI:  0.15 m                             Systemic Diam: 2.00 cm Bartholome Bill MD Electronically signed by Bartholome Bill MD Signature Date/Time: 02/20/2021/10:38:19 AM    Final     Assessment & Plan     87 year old with no prior cardiac abnormalities who presented emergency room with apparent syncope although this has not been witnessed.  EKG showed sinus rhythm with occasional sinus pauses.  No prolonged pauses.  No tachyarrhythmias.  Troponin  was normal thus far.  Electrolytes were unremarkable.  1.  Syncope-unclear whether this was syncope or mechanical fall.  EKG shows sinus rhythm with sinus pauses.  No prolonged bradycardia or prolonged pauses.  Doubt arrhythmia as the etiology of his fall.  We will continue to follow.  Avoid any AV nodal medications.  We will review echocardiogram when available  2.  Confusion- Brain CT unremarkable however brain MRI suggests acute infarction at the junction of the left middle cerebellar peduncle and pons.  There is also micro hemorrhages.  This is the likely etiology of his altered sensorium as well as probably for his fall.  We will avoid any anticoagulation at present  Signed, Chrissie Noa A. Lennix Kneisel MD 02/22/2021, 4:54 PM  Pager: [phone]

## 2021-02-22 NOTE — Plan of Care (Signed)

## 2021-02-22 NOTE — Progress Notes (Signed)
Continue to await d/c to SNF.  Please see D/C summary from 6/15.   Eulogio Bear DO

## 2021-02-23 LAB — GLUCOSE, CAPILLARY: Glucose-Capillary: 86 mg/dL (ref 70–99)

## 2021-02-23 MED ORDER — POLYETHYLENE GLYCOL 3350 17 G PO PACK
17.0000 g | PACK | Freq: Once | ORAL | Status: AC
  Administered 2021-02-23: 17 g via ORAL
  Filled 2021-02-23: qty 1

## 2021-02-23 MED ORDER — KETOTIFEN FUMARATE 0.025 % OP SOLN
1.0000 [drp] | Freq: Two times a day (BID) | OPHTHALMIC | Status: DC
  Administered 2021-02-23: 12:00:00 1 [drp] via OPHTHALMIC
  Filled 2021-02-23: qty 5

## 2021-02-23 NOTE — Progress Notes (Signed)
Patient continues to await d/c to SNF.  See D/C summary from 6/15. Eulogio Bear DO

## 2021-02-23 NOTE — TOC Transition Note (Signed)
Transition of Care [facility]) - CM/SW Discharge Note   Patient Details  Name: Austin Fleming MRN: [id number] Date of Birth: [birth year]  Transition of Care [facility]) CM/SW Contact:  Shelbie Hutching, RN Phone Number: 02/23/2021, 1:33 PM   Clinical Narrative:    Patient medically cleared for discharge to [facility] in [place].  Patient will be going to room 312.  Bedside RN will call report to [phone].  [facility]'s [facility], [facility] no beds today, [facility] cannot offer a bed, and [facility] has no beds.   Patient's daughter will be going to sign paperwork at facility.  Patient will be transported by [facility] and he is next up on the list for transport, should be within the next hour.   Patient and family aware of discharge today.   Final next level of care: [place] Barriers to Discharge: Barriers Resolved   Patient Goals and CMS Choice Patient states their goals for this hospitalization and ongoing recovery are:: Patient working with OT- family interested in SNF CMS Medicare.gov Compare Post Acute Care list provided to:: Patient Choice offered to / list presented to : Adult Children  Discharge Placement              Patient chooses bed at: [facility] Patient to be transferred to facility by: [place] Name of family member notified: [name] (daughter) Patient and family notified of of transfer: 02/23/21  Discharge Plan and Services   Discharge Planning Services: CM Consult Post Acute Care Choice: [place]          DME Arranged: N/A DME Agency: NA                  Social Determinants of Health ([place]) Interventions     Readmission Risk Interventions No flowsheet data found.

## 2021-02-23 NOTE — TOC Progression Note (Signed)
Transition of Care [facility]) - Progression Note    Patient Details  Name: Austin Fleming MRN: [id number] Date of Birth: 08-Mar-1926  Transition of Care [facility]) CM/SW Contact  Shelbie Hutching, RN Phone Number: 02/23/2021, 1:53 PM  Clinical Narrative:    [facility] asked that [facility] transport be pushed back until after 3pm.  The room needs to be cleaned.  [facility] rescheduled for after 3pm.    Expected Discharge Plan: [place] Barriers to Discharge: Barriers Resolved  Expected Discharge Plan and Services Expected Discharge Plan: [place]   Discharge Planning Services: CM Consult Post Acute Care Choice: [place] Living arrangements for the past 2 months: Single Family Home Expected Discharge Date: 02/23/21               DME Arranged: N/A DME Agency: NA                   Social Determinants of Health (SDOH) Interventions    Readmission Risk Interventions No flowsheet data found.

## 2021-02-23 NOTE — Care Management Important Message (Signed)
Important Message  Patient Details  Name: Austin Fleming MRN: [id number] Date of Birth: Apr 16, 1926   Medicare Important Message Given:  N/A - LOS <3 / Initial given by admissions     [name] 02/23/2021, 8:18 AM

## 2021-02-23 NOTE — Discharge Summary (Signed)
Physician Discharge Summary  DYMERE GLUNT [id number] DOB: Nov 16, 1925 DOA: 02/19/2021  PCP: Leonel Ramsay, MD  Admit date: 02/19/2021 Discharge date: 02/23/2021  Admitted From: home Discharge disposition: sNF   Recommendations for Outpatient Follow-Up:   Family prefers outpatient follow-up with [facility] clinic neurology, should be given number to call for appointment [phone] Avoid hypotension- BP goal normotensive Outpatient palliative care services   Discharge Diagnosis:   Principal Problem:   Syncopal episodes Active Problems:   AMS (altered mental status)   Dehydration   CKD (chronic kidney disease), stage IV ([place])   Acute CVA (cerebrovascular accident) [facility])    Discharge Condition: Improved.  Diet recommendation: Regular.  Wound care: None.  Code status: DNR    Hospital Course by Problem:   Acute CVA/severe basilar artery stenosis Per neurology: 87 year old man with past medical history as above presenting with a syncopal episode concerning for brainstem hypoperfusion given posterior circulation distribution strokes and severe basilar artery stenosis.  Unfortunately MRI is also concerning for CAA.  Discussed that diffuse axonal injury secondary to traumatic brain injury could also potentially have a similar pattern of microhemorrhages but family does not think he has had any such event.  Additionally he has been tolerating aspirin 325 mg for years.  Neurology discussed that normally they would choose dual antiplatelet therapy for at least 3 weeks in the setting of his significant basilar artery stenosis however given possibility of CAA this would be high risk and even aspirin alone has risk of hemorrhage in CAA.  In shared decision-making with daughter at bedside, confirmed we will continue aspirin for now, add atorvastatin for his hyperlipidemia, and she will have goals of care discussion with family to discuss what interventions they might want  should he have basilar artery occlusion or lobar hemorrhage in the future.   Recommendations: -Maintain adequate hydration/nutrition to avoid hypotension, goal normotension -Continue aspirin 325 mg daily -Atorvastatin 80 mg nightly -Appreciate PT/OT eval -Family prefers outpatient follow-up with [facility] clinic neurology, should be given number to call for appointment [phone] -Continued goals of care discussion with family on an outpatient basis given high risk of lobar hemorrhage and cerebral amyloid angiopathy as well as high risk of basilar artery occlusion given his severe stenosis  CKD-4/azotemia: Cr slightly higher than baseline likely from dehydration.  Improved with IVF  Sinus pauses/Mobitz type-1: Patient is not on nodal blocking agent or Aricept. -Appreciate input by cardiology. -Avoid nodal blocking agents   Medical Consultants:      Discharge Exam:   Vitals:   02/23/21 0352 02/23/21 0753  BP: 124/73 (!) 145/92  Pulse: 76 83  Resp: 16 16  Temp: 98.3 F (36.8 C) 98.5 F (36.9 C)  SpO2: 94% 91%   Vitals:   02/22/21 1951 02/22/21 2314 02/23/21 0352 02/23/21 0753  BP: 131/83 (!) 144/84 124/73 (!) 145/92  Pulse: 82 83 76 83  Resp: '16 18 16 16  '$ Temp: 97.9 F (36.6 C) 98.4 F (36.9 C) 98.3 F (36.8 C) 98.5 F (36.9 C)  TempSrc: Oral Oral Oral Oral  SpO2: 99% 100% 94% 91%  Weight:      Height:        General exam: Appears calm and comfortable.    The results of significant diagnostics from this hospitalization (including imaging, microbiology, ancillary and laboratory) are listed below for reference.     Procedures and Diagnostic Studies:   US Carotid Bilateral  Result Date: 02/20/2021 CLINICAL DATA:  87 year old male with  symptoms of stroke EXAM: BILATERAL CAROTID DUPLEX ULTRASOUND TECHNIQUE: Pearline Cables scale imaging, color Doppler and duplex ultrasound were performed of bilateral carotid and vertebral arteries in the neck. COMPARISON:  None. FINDINGS:  Criteria: Quantification of carotid stenosis is based on velocity parameters that correlate the residual internal carotid diameter with NASCET-based stenosis levels, using the diameter of the distal internal carotid lumen as the denominator for stenosis measurement. The following velocity measurements were obtained: RIGHT ICA: 94/29 cm/sec CCA: [id number] cm/sec SYSTOLIC ICA/CCA RATIO:  1.1 ECA:  139 cm/sec LEFT ICA: 70/21 cm/sec CCA: [id number] cm/sec SYSTOLIC ICA/CCA RATIO:  1.1 ECA:  69 cm/sec RIGHT CAROTID ARTERY: Heterogeneous atherosclerotic plaque with areas of irregularity throughout the common carotid artery. Plaque extends into the proximal internal carotid artery. By peak systolic velocity criteria, the estimated stenosis is less than 50%. RIGHT VERTEBRAL ARTERY:  Patent with normal antegrade flow. LEFT CAROTID ARTERY: Moderate heterogeneous and irregular atherosclerotic plaque in the distal common carotid artery and proximal internal carotid artery. By peak systolic velocity criteria, the estimated stenosis is less than 50%. LEFT VERTEBRAL ARTERY:  Patent with antegrade flow. IMPRESSION: 1. Mild (1-49%) stenosis proximal right internal carotid artery secondary to heterogeneous and irregular atherosclerotic plaque. 2. Mild (1-49%) stenosis proximal left internal carotid artery secondary to heterogeneous and irregular atherosclerotic plaque. 3. Vertebral arteries are patent with normal antegrade flow. Signed, Criselda Peaches, MD, [facility] Electronically Signed   By: Jacqulynn Cadet M.D.   On: 02/20/2021 08:05   ECHOCARDIOGRAM COMPLETE  Result Date: 02/20/2021    ECHOCARDIOGRAM REPORT   Patient Name:   KENDAHL DENINO Date of Exam: 02/20/2021 Medical Rec #:  [id number]     Height:       66.0 in Accession #:    [id number]    Weight:       151.7 lb Date of Birth:  1926-05-22     BSA:          1.778 m Patient Age:    87 years      BP:            122/68 mmHg Patient Gender: M             HR:           85 bpm. Exam Location:  [facility] Procedure: 2D Echo, Cardiac Doppler and Color Doppler Indications:     Syncope R55  History:         Patient has no prior history of Echocardiogram examinations. No                  medical history on file.  Sonographer:     Sherrie Sport RDCS (AE) Referring Phys:  [id number] Royce Macadamia AGBATA Diagnosing Phys: Bartholome Bill MD IMPRESSIONS  1. Left ventricular ejection fraction, by estimation, is 55 to 60%. The left ventricle has normal function. The left ventricle has no regional wall motion abnormalities. There is moderate left ventricular hypertrophy. Left ventricular diastolic parameters are consistent with Grade I diastolic dysfunction (impaired relaxation).  2. Right ventricular systolic function is normal. The right ventricular size is normal.  3. The mitral valve is grossly normal. Trivial mitral valve regurgitation.  4. The aortic valve is grossly normal. Aortic valve regurgitation is trivial. FINDINGS  Left Ventricle: Left ventricular ejection fraction, by estimation, is 55 to 60%. The left ventricle has normal function. The left ventricle has no regional wall motion abnormalities. The left ventricular internal cavity size was normal  in size. There is  moderate left ventricular hypertrophy. Left ventricular diastolic parameters are consistent with Grade I diastolic dysfunction (impaired relaxation). Right Ventricle: The right ventricular size is normal. No increase in right ventricular wall thickness. Right ventricular systolic function is normal. Left Atrium: Left atrial size was normal in size. Right Atrium: Right atrial size was normal in size. Pericardium: There is no evidence of pericardial effusion. Mitral Valve: The mitral valve is grossly normal. Trivial mitral valve regurgitation. Tricuspid Valve: The tricuspid valve is grossly normal. Tricuspid valve regurgitation is trivial. Aortic Valve: The aortic valve is grossly  normal. Aortic valve regurgitation is trivial. Aortic valve mean gradient measures 2.0 mmHg. Aortic valve peak gradient measures 3.7 mmHg. Aortic valve area, by VTI measures 2.89 cm. Pulmonic Valve: The pulmonic valve was not well visualized. Pulmonic valve regurgitation is trivial. Aorta: The aortic root is normal in size and structure. IAS/Shunts: The atrial septum is grossly normal.  LEFT VENTRICLE PLAX 2D LVIDd:         3.30 cm  Diastology LVIDs:         2.30 cm  LV e' medial:    10.30 cm/s LV PW:         1.10 cm  LV E/e' medial:  10.5 LV IVS:        1.00 cm  LV e' lateral:   5.33 cm/s LVOT diam:     2.00 cm  LV E/e' lateral: 20.3 LV SV:         48 LV SV Index:   27 LVOT Area:     3.14 cm  RIGHT VENTRICLE RV Basal diam:  2.70 cm RV S prime:     10.60 cm/s TAPSE (M-mode): 3.1 cm LEFT ATRIUM             Index       RIGHT ATRIUM           Index LA diam:        1.70 cm 0.96 cm/m  RA Area:     12.90 cm LA Vol (A2C):   21.5 ml 12.09 ml/m RA Volume:   26.20 ml  14.73 ml/m LA Vol (A4C):   18.7 ml 10.52 ml/m LA Biplane Vol: 20.8 ml 11.70 ml/m  AORTIC VALVE                   PULMONIC VALVE AV Area (Vmax):    2.52 cm    RVOT Peak grad: 2 mmHg AV Area (Vmean):   2.45 cm AV Area (VTI):     2.89 cm AV Vmax:           96.10 cm/s AV Vmean:          73.500 cm/s AV VTI:            0.165 m AV Peak Grad:      3.7 mmHg AV Mean Grad:      2.0 mmHg LVOT Vmax:         77.00 cm/s LVOT Vmean:        57.300 cm/s LVOT VTI:          0.152 m LVOT/AV VTI ratio: 0.92  AORTA Ao Root diam: 3.70 cm MITRAL VALVE                TRICUSPID VALVE MV Area (PHT): 9.14 cm     TR Peak grad:   19.9 mmHg MV Decel Time: 83 msec      TR Vmax:  223.00 cm/s MV E velocity: 108.00 cm/s MV A velocity: 51.80 cm/s   SHUNTS MV E/A ratio:  2.08         Systemic VTI:  0.15 m                             Systemic Diam: 2.00 cm Bartholome Bill MD Electronically signed by Bartholome Bill MD Signature Date/Time: 02/20/2021/10:38:19 AM    Final      Labs:    Basic Metabolic Panel: Recent Labs  Lab 02/19/21 0853 02/20/21 0605 02/21/21 0604  NA 138 140 141  K 4.0 4.6 4.4  CL 109 110 108  CO2 '22 24 24  '$ GLUCOSE 130* 104* 100*  BUN 51* 39* 32*  CREATININE 2.28* 1.91* 1.87*  CALCIUM 8.9 8.8* 9.1  MG  --   --  2.1  PHOS  --   --  2.8   GFR Estimated Creatinine Clearance: 22.7 mL/min (A) (by C-G formula based on SCr of 1.87 mg/dL (H)). Liver Function Tests: Recent Labs  Lab 02/21/21 0604  ALBUMIN 3.3*   No results for input(s): LIPASE, AMYLASE in the last 168 hours. Recent Labs  Lab 02/21/21 0604  AMMONIA <9*   Coagulation profile No results for input(s): INR, PROTIME in the last 168 hours.  CBC: Recent Labs  Lab 02/19/21 0853 02/20/21 0605 02/21/21 0604  WBC 11.2* 9.8 9.8  HGB 12.7* 11.9* 12.9*  HCT 39.0 35.8* 38.3*  MCV 91.8 90.2 90.5  PLT 247 222 212   Cardiac Enzymes: No results for input(s): CKTOTAL, CKMB, CKMBINDEX, TROPONINI in the last 168 hours. BNP: Invalid input(s): POCBNP CBG: Recent Labs  Lab 02/21/21 0609 02/21/21 0900 02/21/21 1004 02/22/21 0531 02/23/21 0358  GLUCAP 76 67* 92 89 86   D-Dimer No results for input(s): DDIMER in the last 72 hours. Hgb A1c No results for input(s): HGBA1C in the last 72 hours.  Lipid Profile No results for input(s): CHOL, HDL, LDLCALC, TRIG, CHOLHDL, LDLDIRECT in the last 72 hours.  Thyroid function studies Recent Labs    02/21/21 0604  TSH 1.586   Anemia work up Recent Labs    02/21/21 0604  VITAMINB12 3,858*  FOLATE 25.0  FERRITIN 36  TIBC 290  IRON 67  RETICCTPCT 1.2   Microbiology Recent Results (from the past 240 hour(s))  Resp Panel by RT-PCR (Flu A&B, Covid) Nasopharyngeal Swab     Status: None   Collection Time: 02/19/21  8:58 AM   Specimen: Nasopharyngeal Swab; Nasopharyngeal(NP) swabs in vial transport medium  Result Value Ref Range Status   SARS Coronavirus 2 by RT PCR NEGATIVE NEGATIVE Final    Comment: (NOTE) SARS-CoV-2  target nucleic acids are NOT DETECTED.  The SARS-CoV-2 RNA is generally detectable in upper respiratory specimens during the acute phase of infection. The lowest concentration of SARS-CoV-2 viral copies this assay can detect is 138 copies/mL. A negative result does not preclude SARS-Cov-2 infection and should not be used as the sole basis for treatment or other patient management decisions. A negative result may occur with  improper specimen collection/handling, submission of specimen other than nasopharyngeal swab, presence of viral mutation(s) within the areas targeted by this assay, and inadequate number of viral copies(<138 copies/mL). A negative result must be combined with clinical observations, patient history, and epidemiological information. The expected result is Negative.  Fact Sheet for Patients:  EntrepreneurPulse.com.au  Fact Sheet for Healthcare Providers:  IncredibleEmployment.be  This test is  no t yet approved or cleared by the [place] and  has been authorized for detection and/or diagnosis of SARS-CoV-2 by FDA under an Emergency Use Authorization (EUA). This EUA will remain  in effect (meaning this test can be used) for the duration of the COVID-19 declaration under Section 564(b)(1) of the Act, 21 U.S.C.section 360bbb-3(b)(1), unless the authorization is terminated  or revoked sooner.       Influenza A by PCR NEGATIVE NEGATIVE Final   Influenza B by PCR NEGATIVE NEGATIVE Final    Comment: (NOTE) The Xpert Xpress SARS-CoV-2/FLU/RSV plus assay is intended as an aid in the diagnosis of influenza from Nasopharyngeal swab specimens and should not be used as a sole basis for treatment. Nasal washings and aspirates are unacceptable for Xpert Xpress SARS-CoV-2/FLU/RSV testing.  Fact Sheet for Patients: EntrepreneurPulse.com.au  Fact Sheet for Healthcare  Providers: IncredibleEmployment.be  This test is not yet approved or cleared by the [place] FDA and has been authorized for detection and/or diagnosis of SARS-CoV-2 by FDA under an Emergency Use Authorization (EUA). This EUA will remain in effect (meaning this test can be used) for the duration of the COVID-19 declaration under Section 564(b)(1) of the Act, 21 U.S.C. section 360bbb-3(b)(1), unless the authorization is terminated or revoked.  Performed at [facility], [address] [place], [place] [address]   Resp Panel by RT-PCR (Flu A&B, Covid) Nasopharyngeal Swab     Status: None   Collection Time: 02/22/21  4:00 PM   Specimen: Nasopharyngeal Swab; Nasopharyngeal(NP) swabs in vial transport medium  Result Value Ref Range Status   SARS Coronavirus 2 by RT PCR NEGATIVE NEGATIVE Final    Comment: (NOTE) SARS-CoV-2 target nucleic acids are NOT DETECTED.  The SARS-CoV-2 RNA is generally detectable in upper respiratory specimens during the acute phase of infection. The lowest concentration of SARS-CoV-2 viral copies this assay can detect is 138 copies/mL. A negative result does not preclude SARS-Cov-2 infection and should not be used as the sole basis for treatment or other patient management decisions. A negative result may occur with  improper specimen collection/handling, submission of specimen other than nasopharyngeal swab, presence of viral mutation(s) within the areas targeted by this assay, and inadequate number of viral copies(<138 copies/mL). A negative result must be combined with clinical observations, patient history, and epidemiological information. The expected result is Negative.  Fact Sheet for Patients:  EntrepreneurPulse.com.au  Fact Sheet for Healthcare Providers:  IncredibleEmployment.be  This test is no t yet approved or cleared by the [place] FDA and  has been authorized for  detection and/or diagnosis of SARS-CoV-2 by FDA under an Emergency Use Authorization (EUA). This EUA will remain  in effect (meaning this test can be used) for the duration of the COVID-19 declaration under Section 564(b)(1) of the Act, 21 U.S.C.section 360bbb-3(b)(1), unless the authorization is terminated  or revoked sooner.       Influenza A by PCR NEGATIVE NEGATIVE Final   Influenza B by PCR NEGATIVE NEGATIVE Final    Comment: (NOTE) The Xpert Xpress SARS-CoV-2/FLU/RSV plus assay is intended as an aid in the diagnosis of influenza from Nasopharyngeal swab specimens and should not be used as a sole basis for treatment. Nasal washings and aspirates are unacceptable for Xpert Xpress SARS-CoV-2/FLU/RSV testing.  Fact Sheet for Patients: EntrepreneurPulse.com.au  Fact Sheet for Healthcare Providers: IncredibleEmployment.be  This test is not yet approved or cleared by the [place] FDA and has been authorized for detection and/or diagnosis of SARS-CoV-2  by FDA under an Emergency Use Authorization (EUA). This EUA will remain in effect (meaning this test can be used) for the duration of the COVID-19 declaration under Section 564(b)(1) of the Act, 21 U.S.C. section 360bbb-3(b)(1), unless the authorization is terminated or revoked.  Performed at [facility], [address] [place], [place] [address]      Discharge Instructions:   Discharge Instructions     Diet general   Complete by: As directed    Increase activity slowly   Complete by: As directed       Allergies as of 02/23/2021   No Known Allergies      Medication List     TAKE these medications    acetaminophen 500 MG tablet Commonly known as: TYLENOL Take 500 mg by mouth every 6 (six) hours as needed.   aspirin 325 MG tablet Take 325 mg by mouth daily.   atorvastatin 20 MG tablet Commonly known as: LIPITOR Take 4 tablets (80 mg total) by mouth at  bedtime.   Cholecalciferol 25 MCG (1000 UT) capsule Take 1,000 Units by mouth daily.   cyanocobalamin 1000 MCG tablet Take 1,000 mcg by mouth daily.   OSTEO BI-FLEX ADV TRIPLE ST PO Take 1 tablet by mouth daily.         Contact information for after-discharge care     Destination     [facility] Preferred SNF .   Service: Skilled Nursing Contact information: [id number] [place] [phone]                      Time coordinating discharge: 35 min  Signed:  Geradine Girt DO  [facility] 02/23/2021, 1:10 PM

## 2021-02-23 NOTE — Progress Notes (Signed)
Report called to [facility], discharge packet given to patient, pt left with [facility] transportation

## 2021-02-23 NOTE — Progress Notes (Signed)
Physical Therapy Treatment Patient Details Name: Austin Fleming MRN: [id number] DOB: September 01, 1926 Today's Date: 02/23/2021    History of Present Illness Austin Fleming is a 87 y.o. male with history of no apparent cardiac abnormalities who was brought to the emergency room after an apparent syncopal episode. Patient was sitting on the edge of his bed and subsequently was found on the floor. Patient is not able to give a history of the event. Patient was apparently nauseated, diaphoretic, and somewhat confused. Subsequent brain MRI revealed an acute infarct at the junction of the left middle cerebellar peduncle and pons. Additional punctate infarct in the high right frontoparietal white matter.  Supratentorial microhemorrhages in the gray-white matter junction suggested possible cerebral amyloid angiopathy. This appeared embolic due to bilateral distribution.    PT Comments    Pt received in Semi-Fowler's position and agreeable to therapy.  Pt is in much better spirits and is more willing to participate with therapy today.  Pt was able to perform bed level exercises with good technique and no difficulty.  Pt then proceeded to perform bed mobility and come seated EOB.  Pt was able to stand and then perform lateral weight shifts and marching in standing.  Pt progressed to bing able to perform lateral steps at EOB and then 3-4 steps towards the wall and back.  Pt then participated in STS x5.  Pt did not continue with ambulation due to muscle trembling and reports of being fatigued.  Current discharge plans to SNF remain appropriate at this time.  Pt will continue to benefit from skilled therapy in order to address deficits listed below.     Follow Up Recommendations  SNF     Equipment Recommendations       Recommendations for Other Services       Precautions / Restrictions Precautions Precautions: Fall Restrictions Weight Bearing Restrictions: No    Mobility  Bed Mobility Overal bed mobility:  Needs Assistance Bed Mobility: Sit to Supine;Supine to Sit     Supine to sit: Min guard Sit to supine: Min guard   General bed mobility comments: Min guard for safety.    Transfers Overall transfer level: Needs assistance Equipment used: Rolling walker (2 wheeled) Transfers: Sit to/from Stand Sit to Stand: Min guard            Ambulation/Gait Ambulation/Gait assistance: Min guard Gait Distance (Feet): 8 Feet Assistive device: Rolling walker (2 wheeled) Gait Pattern/deviations: Step-to pattern Gait velocity: decreased   General Gait Details: Lateral steps along the EOB, and 3 steps forward/backward.   Stairs             Wheelchair Mobility    Modified Rankin (Stroke Patients Only)       Balance Overall balance assessment: Needs assistance Sitting-balance support: Bilateral upper extremity supported Sitting balance-Leahy Scale: Fair   Postural control: Posterior lean Standing balance support: Bilateral upper extremity supported Standing balance-Leahy Scale: Poor                              Cognition Arousal/Alertness: Awake/alert Behavior During Therapy: WFL for tasks assessed/performed Overall Cognitive Status: Impaired/Different from baseline                                 General Comments: Pt has history of cognitive impairments, but performed much better than at initial evaluation.  Exercises Total Joint Exercises Ankle Circles/Pumps: AROM;Strengthening;Both;10 reps;Supine Quad Sets: AROM;Strengthening;Both;10 reps;Supine Gluteal Sets: AROM;Strengthening;Both;10 reps;Supine Towel Squeeze: AROM;Strengthening;Both;10 reps;Supine Heel Slides: AROM;Strengthening;Both;10 reps;Supine Hip ABduction/ADduction: AROM;Strengthening;Both;10 reps;Supine Straight Leg Raises: AROM;Strengthening;Both;10 reps;Supine Marching in Standing: AROM;Strengthening;Both;10 reps;Standing    General Comments        Pertinent  Vitals/Pain Pain Assessment: No/denies pain    Home Living                      Prior Function            PT Goals (current goals can now be found in the care plan section) Acute Rehab PT Goals Patient Stated Goal: to go home PT Goal Formulation: With patient/family Time For Goal Achievement: 03/06/21 Potential to Achieve Goals: Fair Progress towards PT goals: Progressing toward goals    Frequency    Min 2X/week      PT Plan      Co-evaluation              AM-PAC PT "6 Clicks" Mobility   Outcome Measure  Help needed turning from your back to your side while in a flat bed without using bedrails?: A Lot Help needed moving from lying on your back to sitting on the side of a flat bed without using bedrails?: A Lot Help needed moving to and from a bed to a chair (including a wheelchair)?: A Lot Help needed standing up from a chair using your arms (e.g., wheelchair or bedside chair)?: A Little Help needed to walk in hospital room?: A Little Help needed climbing 3-5 steps with a railing? : Total 6 Click Score: 13    End of Session Equipment Utilized During Treatment: Gait belt Activity Tolerance: Patient tolerated treatment well Patient left: in bed;with call bell/phone within reach;with bed alarm set;with family/visitor present Nurse Communication: Mobility status PT Visit Diagnosis: Unsteadiness on feet (R26.81);Other abnormalities of gait and mobility (R26.89);Muscle weakness (generalized) (M62.81);History of falling (Z91.81);Difficulty in walking, not elsewhere classified (R26.2)     Time: 1129-1202 PT Time Calculation (min) (ACUTE ONLY): 33 min  Charges:  $Gait Training: 8-22 mins $Therapeutic Exercise: 8-22 mins                     Gwenlyn Saran, PT, DPT 02/23/21, 2:49 PM    Christie Nottingham 02/23/2021, 2:44 PM

## 2021-02-26 ENCOUNTER — Encounter: Payer: Self-pay | Admitting: Internal Medicine

## 2021-02-26 ENCOUNTER — Non-Acute Institutional Stay (SKILLED_NURSING_FACILITY): Payer: Medicare Other | Admitting: Internal Medicine

## 2021-02-26 DIAGNOSIS — N184 Chronic kidney disease, stage 4 (severe): Secondary | ICD-10-CM | POA: Diagnosis not present

## 2021-02-26 DIAGNOSIS — I639 Cerebral infarction, unspecified: Secondary | ICD-10-CM | POA: Diagnosis not present

## 2021-02-26 DIAGNOSIS — I441 Atrioventricular block, second degree: Secondary | ICD-10-CM

## 2021-02-26 DIAGNOSIS — R55 Syncope and collapse: Secondary | ICD-10-CM

## 2021-02-26 DIAGNOSIS — R739 Hyperglycemia, unspecified: Secondary | ICD-10-CM

## 2021-02-26 NOTE — Assessment & Plan Note (Signed)
No hypoglycemic therapy indicated

## 2021-02-26 NOTE — Assessment & Plan Note (Signed)
Med list reviewed; no nephrotoxic medications present.

## 2021-02-26 NOTE — Patient Instructions (Addendum)
See assessment and plan under each diagnosis in the problem list and acutely for this visit. Code status discussed & verified w daughter.

## 2021-02-26 NOTE — Progress Notes (Signed)
NURSING HOME LOCATION:  [facility]  ROOM NUMBER: [id number]  CODE STATUS:  DNR as per daughter, Octavio Graves, 02/23/2021  PCP:  Adrian Prows MD  This is a comprehensive admission note to this SNFperformed on this date less than 30 days from date of admission. Included are preadmission medical/surgical history; reconciled medication list; family history; social history and comprehensive review of systems.  Corrections and additions to the records were documented. Comprehensive physical exam was also performed. Additionally a clinical summary was entered for each active diagnosis pertinent to this admission in the Problem List to enhance continuity of care.  HPI: Patient was hospitalized 6/13 - 02/23/2021 with syncopal episodes.  Neurology consultant felt that this could be clinically related to brainstem hypoperfusion given posterior circulation distribution strokes and severe basilar artery stenosis.  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  Diffuse axonal injury secondary to traumatic brain injury could have a similar imaging pattern of microhemorrhages , but patient's family did not feel he had had head trauma to cause such.  Also he had been on aspirin 325 mg for years.  Normally Neurology initially would recommend dual antiplatelet therapy at least 3 weeks in the setting of significant basilar artery stenosis; however, given the possibility of bleeding with even aspirin alone .Daughter was consulted and aspirin was continued.  Atorvastatin 80 mg daily was added for dyslipidemia.  Family wanted to pursue neurologic follow-up with the [facility] [phone]). Course was complicated by CKD stage IV with azotemia.  The creatinine did improve slightly with IV fluids. EKG revealed sinus pauses with Mobitz type I block.  The patient was not on any nodal blocking agent or Aricept.  Cardiology consult recommended avoiding  such. Palliative care as an outpatient was recommended.  The daughter did subsequently agreed to DNR on 02/23/2021.  Past medical and surgical history: His only prior hospitalization was related to fall clinically felt to be related to dehydration.  He has had no surgeries according to his daughter. Social history: Nondrinker; former smoker and user of smokeless tobacco products.  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  Family history: Family history is noncontributory due to his advanced age of 63.   Review of systems:  Date given as "June 2022".  He was able to identify the [place].  He thought [place] was "east of here".  He denied any active symptoms. His daughter validated this,stating only that his oral intake of food and liquids had decreased for 2 days prior to the apparent syncope.  She stated that her brother found him on the floor.  There was no seizure or other prodrome noted.  Constitutional: No fever, significant weight change, fatigue  Eyes: No redness, discharge, pain, vision change ENT/mouth: No nasal congestion, purulent discharge, earache, change in hearing, sore throat  Cardiovascular: No chest pain, palpitations, paroxysmal nocturnal dyspnea, claudication, edema  Respiratory: No cough, sputum production, hemoptysis, DOE, significant snoring, apnea Gastrointestinal: No heartburn, dysphagia, abdominal pain, nausea /vomiting, rectal bleeding, melena, change in bowels Genitourinary: No dysuria, hematuria, pyuria, incontinence, nocturia Musculoskeletal: No joint stiffness, joint swelling, weakness, pain Dermatologic: No rash, pruritus, change in appearance of skin Neurologic: No dizziness, headache, seizures, numbness, tingling Psychiatric: No significant anxiety, depression, insomnia, anorexia Endocrine: No change in hair/skin/nails, excessive thirst, excessive hunger, excessive urination  Hematologic/lymphatic: No significant  bruising, lymphadenopathy, abnormal bleeding Allergy/immunology: No itchy/watery eyes, significant sneezing, urticaria, angioedema  Physical exam:  Pertinent or positive findings: He appears younger than his stated age.  Pattern alopecia is present.  He tends to keep his head tilted to the left,suggestive of torticollis.  He has a mustache.  He has only 2 remaining mandibular teeth but is otherwise edentulous.  Slight tachycardia is noted.  Pedal pulses are decreased and the skin over the feet and ankles cool to touch.  Trace edema is present at the ankles.  The upper extremities are stronger than the lower extremities to opposition.  General appearance: no acute distress, increased work of breathing is present.   Lymphatic: No lymphadenopathy about the head, neck, axilla. Eyes: No conjunctival inflammation or lid edema is present. There is no scleral icterus. Ears:  External ear exam shows no significant lesions or deformities.   Nose:  External nasal examination shows no deformity or inflammation. Nasal mucosa are pink and moist without lesions, exudates Oral exam: Lips and gums are healthy appearing.There is no oropharyngeal erythema or exudate. Neck:  No thyromegaly, masses, tenderness noted.    Heart:  No murmur, click, rub.  Lungs:  without wheezes, rhonchi, rales, rubs. Abdomen: Bowel sounds are normal.  Abdomen is soft and nontender with no organomegaly, hernias, masses. GU: Deferred  Extremities:  No cyanosis, clubbing. Neurologic exam:  Balance, Rhomberg, finger to nose testing could not be completed due to clinical state No significant lesions or rash.  See clinical summary under each active problem in the Problem List with associated updated therapeutic plan

## 2021-02-26 NOTE — Assessment & Plan Note (Signed)
Avoid nodal blocking agents & Aricept

## 2021-02-26 NOTE — Assessment & Plan Note (Signed)
Continue ASA alone; Neuro F/U w [facility]

## 2021-03-01 NOTE — Assessment & Plan Note (Signed)
Asymmetric limb weakness w/o evidence of clinical progression on ASA. No bleeding on ASA.

## 2021-03-05 ENCOUNTER — Encounter: Payer: Self-pay | Admitting: Internal Medicine

## 2021-03-05 ENCOUNTER — Non-Acute Institutional Stay (SKILLED_NURSING_FACILITY): Payer: Medicare Other | Admitting: Internal Medicine

## 2021-03-05 DIAGNOSIS — K068 Other specified disorders of gingiva and edentulous alveolar ridge: Secondary | ICD-10-CM | POA: Diagnosis not present

## 2021-03-05 NOTE — Progress Notes (Signed)
   NURSING HOME LOCATION:  [facility] ROOM NUMBER:  [id number]  CODE STATUS:  DNR  PCP:  Adrian Prows MD  This is a nursing facility follow up visit for specific acute issue of mouth pain.  Interim medical record and care since last SNF visit was updated with review of diagnostic studies and change in clinical status since last visit were documented.  HPI:He has had mouth pain for 2-3 days. His daughter requested he be seen today. He states he only has pain with eating. His daughter states this was not reported PTA to SNF ; but she cooked all of his meals in a crock pot @ home to ensure soft consistency.  Review of systems: No fever , no purulent oral secretions. ,sore throat, or angioedema  Physical exam:  Pertinent or positive findings: Maxilla edentulous with isolated minimal residual carious remnants below gumline w/o cellulitis or purulence. Two remaining mandibular teeth with plaque.  General appearance: no acute distress, increased work of breathing is present.   No lymphadenopathy about the head, neck, axilla.  See summary under each active problem in the Problem List with associated updated therapeutic plan

## 2021-03-07 ENCOUNTER — Non-Acute Institutional Stay (SKILLED_NURSING_FACILITY): Payer: Medicare Other | Admitting: Internal Medicine

## 2021-03-07 ENCOUNTER — Encounter: Payer: Self-pay | Admitting: Internal Medicine

## 2021-03-07 DIAGNOSIS — R55 Syncope and collapse: Secondary | ICD-10-CM

## 2021-03-07 DIAGNOSIS — D649 Anemia, unspecified: Secondary | ICD-10-CM

## 2021-03-07 DIAGNOSIS — N184 Chronic kidney disease, stage 4 (severe): Secondary | ICD-10-CM

## 2021-03-07 DIAGNOSIS — R739 Hyperglycemia, unspecified: Secondary | ICD-10-CM | POA: Diagnosis not present

## 2021-03-07 NOTE — Assessment & Plan Note (Signed)
With an A1c of 6.1% there is no indication for any diabetic therapy.  His A1c goal is 8% in the setting of advanced age and comorbidities.

## 2021-03-07 NOTE — Assessment & Plan Note (Signed)
If he has recurrent syncopal episodes; consideration for event monitor as he has exhibited Mobitz block type I.

## 2021-03-07 NOTE — Patient Instructions (Signed)
See assessment and plan under diagnosis acutely for this visit

## 2021-03-07 NOTE — Assessment & Plan Note (Addendum)
Med list was reviewed;he is on no nephrotoxic drugs.

## 2021-03-07 NOTE — Patient Instructions (Addendum)
See assessment and plan under each diagnosis in the problem list  for this visit. Copy of the discharge summary sent to his PCP as well as  the Neurology [facility]

## 2021-03-07 NOTE — Assessment & Plan Note (Signed)
No evidence of bleeding dyscrasias on the high-dose aspirin.  It is recommended that he take this after a meal.

## 2021-03-07 NOTE — Progress Notes (Signed)
   NURSING HOME LOCATION:  [facility] ROOM NUMBER:  [id number]  CODE STATUS:  DNR  PCP:  Adrian Prows MD  The patient is being discharged from [facility] on 03/07/2021 by Unice Cobble MD.  I have reviewed the SNF medical records and labs and updated his chart.  Summary of [place] medical records: Patient was admitted to this facility 02/23/2021 following a hospitalization 6/13-6/17 with possible syncopal episodes.  Neurology consultant felt this was related to brainstem hypoperfusion given posterior cervical distribution strokes and severe basilar artery stenosis.  MRI also suggested possible cerebral amyloid angiopathy (CAA).  Diffuse axonal injury secondary to traumatic brain injury could have a similar imaging pattern of microhemorrhages; family denied any head trauma of significance. Neurology recommended full dose aspirin alone given the possibility of bleeding. High-dose statin was continued for secondary prevention. Family plans follow-up with the [facility] Neurology [facility]. While hospitalized he did exhibit CKD stage IV with azotemia.  IV fluids did result in some improvement in the creatinine. EKG revealed sinus pauses with Mobitz type I block.  Cardiology recommended that nodal blocking agents or Aricept be avoided because of this. Palliative care as an outpatient was recommended and daughter agreed to DNR as of 6/17. According to his daughter his only prior hospitalization was related to the fall in the context of dehydration.  She denies any history of surgeries.  Review of systems: Pertinent or active symptoms include: He was seen 6/27 for gum pain.  This was related to carious changes of minimal residual teeth in the maxilla. PT/OT worked with the patient and he progressed with improvement in mobilization employing a walker.  Physical exam the day of discharge could not be completed as his daughter picked him up at 9 AM this morning prior to my  arrival.  See clinical summary of Discharge Diagnoses in the Problem List with associated updated therapeutic plan  Follow-up will be by the primary care physician in seven - 14 days.

## 2021-05-24 ENCOUNTER — Other Ambulatory Visit: Payer: Self-pay | Admitting: Physician Assistant

## 2021-05-24 DIAGNOSIS — R633 Feeding difficulties, unspecified: Secondary | ICD-10-CM

## 2021-05-24 DIAGNOSIS — T17908A Unspecified foreign body in respiratory tract, part unspecified causing other injury, initial encounter: Secondary | ICD-10-CM

## 2021-05-24 DIAGNOSIS — R1310 Dysphagia, unspecified: Secondary | ICD-10-CM

## 2021-05-29 ENCOUNTER — Ambulatory Visit: Payer: Medicare Other | Attending: Infectious Diseases | Admitting: Speech Pathology

## 2021-05-29 ENCOUNTER — Other Ambulatory Visit: Payer: Self-pay

## 2021-05-29 DIAGNOSIS — R1310 Dysphagia, unspecified: Secondary | ICD-10-CM | POA: Insufficient documentation

## 2021-05-30 NOTE — Therapy (Signed)
[place] MAIN [facility] SERVICES [address] [place], [place], [address] Phone: [phone]   Fax:  [phone]  Speech Language Pathology Evaluation  Patient Details  Name: Austin Fleming MRN: [id number] Date of Birth: 09/21/1925 Referring Provider (SLP): Adrian Prows   Encounter Date: 05/29/2021   End of Session - 05/30/21 2152     Visit Number 1    Number of Visits 17    Date for SLP Re-Evaluation 07/25/21    Authorization Type Medicare    Authorization Time Period 05/29/2021 thru 07/25/2021    Authorization - Visit Number 1    Progress Note Due on Visit 10    SLP Start Time 1400    SLP Stop Time  1500    SLP Time Calculation (min) 60 min    Activity Tolerance Patient tolerated treatment well             No past medical history on file.  No past surgical history on file.  There were no vitals filed for this visit.   Subjective Assessment - 05/30/21 2138     Subjective pt pleasant, fair historian, accompanied by daughter and son-in-law    Patient is accompained by: Family member    Currently in Pain? No/denies                SLP Evaluation [facility] - 05/30/21 2138       SLP Visit Information   SLP Received On 05/29/21    Referring Provider (SLP) Adrian Prows    Onset Date 05/18/2021    Medical Diagnosis Dysphagia, unspecified      General Information   HPI Austin Fleming is a 87 y.o. with past medical history of CVA, HLD, syncope, anemia, hyperglycemia, vitamin deficiency, who presents with his daughter and son-in-law. Pt referred for a Clinical Swallow Evaluation by Dr Adrian Prows for reported coughing after meals. Pt with no history of respiratory compromise or unintentional weight loss.    Behavioral/Cognition appropriate, fair historian    Mobility Status arrived in [employer] chair      Balance Screen   Has the patient fallen in the past 6 months No    Has the patient had a decrease in activity level  because of a fear of falling?  No    Is the patient reluctant to leave their home because of a fear of falling?  No      Prior Functional Status   Cognitive/Linguistic Baseline Baseline deficits    Type of Home House     Lives With Son    Available Support Family      Cognition   Overall Cognitive Status History of cognitive impairments - at baseline      Oral Motor/Sensory Function   Overall Oral Motor/Sensory Function Appears within functional limits for tasks assessed                             SLP Education - 05/30/21 2151     Education Details results of clinical swallow evaluation, plan for instrumental study on Sept 23    Person(s) Educated Patient;Child(ren)    Methods Explanation;Demonstration;Verbal cues;Handout    Comprehension Verbalized understanding;Returned demonstration                SLP Long Term Goals - 05/30/21 2154       SLP LONG TERM GOAL #1   Title Pt will consume least restrictive diet with no  s/s of respiratory distress.    Time 8    Period Weeks    Status New    Target Date 07/25/21              Plan - 05/30/21 2153     Clinical Impression Statement Pt presents with adequate oropharyngeal abilities when consuming trials of thin liquids via cup, top of bottle water, applesauce and mixed consistencies. Pt's oral phase was slightly prolonged with mixed consistencies d/t lack of dentition but was appropriate and at baseline per family report. Pt's pharyngeal phase appeared swift and was free of any overt s/s of aspiration. Pt's voice and respiratory status remained stable throughout. Pt burped ~ 5 times during consumption and pt reports nonspecific globus sensation. Pt's family reports that pt frequently coughs ~10 to 15 minutes after eating. During the evaluation, pt did begin coughing after said time period. The cough was too delayed to suggest that it would be related to aspiration. Since silent aspiration cannot be ruled  out during a clinical swallow evaluation, will proceed with previously ordered Modified Barium Swallow Study on Sept 23.    Speech Therapy Frequency 2x / week    Duration 8 weeks    Treatment/Interventions Diet toleration management by SLP;Aspiration precaution training;SLP instruction and feedback;Patient/family education;Other (comment)   instrumental study   Potential to Achieve Goals Good    Consulted and Agree with Plan of Care Patient;Family member/caregiver    Family Member Consulted pt's daughter and son-in-law             Patient will benefit from skilled therapeutic intervention in order to improve the following deficits and impairments:   Dysphagia, unspecified type    Problem List Patient Active Problem List   Diagnosis Date Noted   Normochromic normocytic anemia 03/07/2021   Hyperglycemia 02/26/2021   Mobitz type 1 second degree AV block 02/26/2021   AMS (altered mental status) 02/19/2021   Syncopal episodes 02/19/2021   Dehydration 02/19/2021   CKD (chronic kidney disease), stage IV ([place]) 02/19/2021   Acute CVA (cerebrovascular accident) ([place]) 02/19/2021   Alic Hilburn B. Rutherford Nail M.S., CCC-SLP, [place] Office [phone]  Stormy Fabian 05/30/2021, 10:00 PM  [place] MAIN [facility] SERVICES [address] [place], [place], [address] Phone: [phone]   Fax:  [phone]  Name: Austin Fleming MRN: [id number] Date of Birth: 11-Dec-1925

## 2021-06-01 ENCOUNTER — Other Ambulatory Visit: Payer: Self-pay

## 2021-06-01 ENCOUNTER — Ambulatory Visit
Admission: RE | Admit: 2021-06-01 | Discharge: 2021-06-01 | Disposition: A | Discharge status: Home / Self Care | Payer: Medicare Other | Source: Ambulatory Visit | Attending: Physician Assistant | Admitting: Physician Assistant

## 2021-06-01 DIAGNOSIS — R1314 Dysphagia, pharyngoesophageal phase: Secondary | ICD-10-CM | POA: Diagnosis present

## 2021-06-01 DIAGNOSIS — R633 Feeding difficulties, unspecified: Secondary | ICD-10-CM | POA: Insufficient documentation

## 2021-06-01 DIAGNOSIS — R1310 Dysphagia, unspecified: Secondary | ICD-10-CM | POA: Diagnosis present

## 2021-06-01 DIAGNOSIS — T17908A Unspecified foreign body in respiratory tract, part unspecified causing other injury, initial encounter: Secondary | ICD-10-CM | POA: Diagnosis present

## 2021-06-01 NOTE — Therapy (Addendum)
[place], [place], [address] Phone: [phone]   Fax:     Modified Barium Swallow  Patient Details  Name: Austin Fleming MRN: [id number] Date of Birth: 10/03/1925 Referring Provider (SLP): Adrian Prows   Encounter Date: 06/01/2021   End of Session - 06/01/21 1804     Visit Number 1    Number of Visits 1    Date for SLP Re-Evaluation 06/01/21    SLP Start Time 1430    SLP Stop Time  1530    SLP Time Calculation (min) 60 min    Activity Tolerance Patient tolerated treatment well             No past medical history on file.  No past surgical history on file.  There were no vitals filed for this visit.   Subjective: Patient behavior: (alertness, ability to follow instructions, etc.): pleasant, verbal and followed 1-step instructions w/ cues. Daughter reported Baseline Cognitive decline/Dementia.  Chief complaint: dysphagia. Daughter/pt described s/s of REFLUX activity (satiety, dry coughing ~15-20 mins post meals and during the middle of the night). Pt has had NO diagnosis/reports/txs of pneumonia or other pulmonary issues per chart and Daughter. He recently had a CVA in May w/ mild Left sided motor weakness (no swallowing issues reported); discharged from Rehab in June back to Home w/ family who provide 24/7 care. Pt takes his Pills in a PUREE at Baseline.   OM Exam: grossly WFL w/ slight decreased labial tone in R corner of mouth; Mild tremorous oral and lingual activity. Speech clear, low volume. Missing most Dentition - only few front Dentition; missing molars Cough+    Objective:  Radiological Procedure: A videoflouroscopic evaluation of oral-preparatory, reflex initiation, and pharyngeal phases of the swallow was performed; as well as a screening of the upper esophageal phase.  POSTURE: upright VIEW: lateral COMPENSATORY STRATEGIES: f/u, DRY swallow intermittently to  reduce/clear any pharyngeal residue BOLUSES ADMINISTERED:  Thin Liquid: 8 trials; 1 multiple swallow trial (CUP drinking as is his Baseline)  Nectar-thick Liquid: 1 trial  Honey-thick Liquid: NT  Puree: 3 trials  Mechanical Soft: 2 trials RESULTS OF EVALUATION: ORAL PREPARATORY PHASE: (The lips, tongue, and velum are observed for strength and coordination)       **Overall Severity Rating: Mild. Tremorous lingual activity during bolus management and A-P transfer. Min premature spillage into the pharynx noted intermittently w/ thin liquids(loss of bolus control). Bolus piecemealing noted, but grossly WFL for age. Oral clearing achieved afterwards.   SWALLOW INITIATION/REFLEX: (The reflex is normal if "triggered" by the time the bolus reached the base of the tongue)  **Overall Severity Rating: [facility]. The majority of boluses triggered a pharyngeal swallow at the level of the Valleculae; slight-min spillage from the Valleculae w/ thin liquids w/ larger sips. Appropriate airway closure and protection occurred during study.   PHARYNGEAL PHASE: (Pharyngeal function is normal if the bolus shows rapid, smooth, and continuous transit through the pharynx and there is no pharyngeal residue after the swallow)  **Overall Severity Rating: grossly adequate-Mild. F/u, Dry swallow appeared helpful in reducing/clearing any slight-min pharyngeal residue that occurred intermittently w/ oral intake. Adequate laryngeal excursion and pharyngeal pressure the majority of trials for age.    LARYNGEAL PENETRATION: (Material entering into the laryngeal inlet/vestibule but not aspirated): NONE ASPIRATION: NONE ESOPHAGEAL PHASE: (Screening of the upper esophagus): slightly prominent cricopharyngeus muscle during the swallow but no obvious impediment of bolus motility and clearing.  ASSESSMENT: Pt appeared to present w/ a grossly WFL oropharyngeal phase swallow function for his age during this study today. NO laryngeal  penetration nor aspiration occurred during this study w/ bolus trial consistencies given. Pt appears at reduced risk for aspiration from an oropharyngeal phase swallowing standpoint when general aspiration precautions. Of Note, there appeared to be a slightly prominent cricopharyngeus muscle during the swallow, but no obvious impediment of bolus motility or clearing. Suspect this could be related to pt's c/o s/s of Reflux history, and current issues, as per report by Daughter and pt.  During the oral phase, mildly tremorous lingual activity noted during bolus management and A-P transfer. Min premature spillage into the pharynx noted intermittently w/ thin liquids(loss of bolus control). Bolus piecemealing noted, but grossly WFL for age. Oral clearing achieved afterwards. During the pharyngeal phase, the majority of boluses triggered a pharyngeal swallow at the level of the Valleculae; slight-min spillage from the Valleculae w/ thin liquids w/ larger sips. Appropriate airway closure and protection occurred during study. Pt does not use straws at home at Baseline. A f/u, Dry swallow appeared helpful in reducing/clearing any slight-min pharyngeal residue that occurred intermittently w/ oral intake. Adequate laryngeal excursion and pharyngeal pressure occurred during trials for age.   During the pharyngeal phase, a slightly prominent cricopharyngeus muscle noted during the swallow, but no obvious impediment of bolus motility or clearing.    PLAN/RECOMMENDATIONS:  A. Diet: continue a more mech soft/Minced meats diet w/ moistened foods; Thin liquids VIA CUP (baseline for pt).  Continue Pills CRUSHED in a Puree (baseline).  B. Swallowing Precautions: general aspiration precautions; REFLUX PRECAUTIONS. Continue to use of a f/u, DRY swallow b/t bites/sips to clear any pharyngeal residue. Alternate b/t trials of foods and liquids as needed.   C. Recommended consultation to: GI for consultation of REFLUX s/s and  behaviors; management, Education, and tx w/ PPI possibly.  D. Therapy recommendations: f/u w/ ST services if needed for continued education on oropharyngeal swallowing, general aspiration and REFLUX precautions.  E. Results and recommendations were discussed w/ pt and Daughter; video viewed and questions answered. Education provided on general REFLUX precautions. Handouts.        Dysphagia, pharyngoesophageal phase  Dysphagia, unspecified type - Plan: DG SWALLOW FUNC OP MEDICARE SPEECH PATH, DG SWALLOW FUNC OP MEDICARE SPEECH PATH     Problem List Patient Active Problem List   Diagnosis Date Noted   Normochromic normocytic anemia 03/07/2021   Hyperglycemia 02/26/2021   Mobitz type 1 second degree AV block 02/26/2021   AMS (altered mental status) 02/19/2021   Syncopal episodes 02/19/2021   Dehydration 02/19/2021   CKD (chronic kidney disease), stage IV ([place]) 02/19/2021   Acute CVA (cerebrovascular accident) ([place]) 02/19/2021        Orinda Kenner, [place], [place] Speech Language Pathologist Rehab Services [id number] [facility] 06/01/2021, 6:05 PM  [place], [place], [address] Phone: [phone]   Fax:     Name: Austin Fleming MRN: [id number] Date of Birth: 09-05-1926

## 2022-02-05 ENCOUNTER — Inpatient Hospital Stay: Payer: Medicare Other

## 2022-02-05 ENCOUNTER — Emergency Department: Payer: Medicare Other

## 2022-02-05 ENCOUNTER — Inpatient Hospital Stay
Admission: EM | Admit: 2022-02-05 | Discharge: 2022-02-11 | DRG: 291 | Disposition: A | Discharge status: Skilled Nursing Facility | Payer: Medicare Other | Attending: Internal Medicine | Admitting: Internal Medicine

## 2022-02-05 DIAGNOSIS — F03C0 Unspecified dementia, severe, without behavioral disturbance, psychotic disturbance, mood disturbance, and anxiety: Secondary | ICD-10-CM | POA: Diagnosis not present

## 2022-02-05 DIAGNOSIS — I5043 Acute on chronic combined systolic (congestive) and diastolic (congestive) heart failure: Principal | ICD-10-CM | POA: Diagnosis present

## 2022-02-05 DIAGNOSIS — Z66 Do not resuscitate: Secondary | ICD-10-CM | POA: Diagnosis present

## 2022-02-05 DIAGNOSIS — N179 Acute kidney failure, unspecified: Secondary | ICD-10-CM | POA: Diagnosis present

## 2022-02-05 DIAGNOSIS — D519 Vitamin B12 deficiency anemia, unspecified: Secondary | ICD-10-CM | POA: Diagnosis present

## 2022-02-05 DIAGNOSIS — T68XXXA Hypothermia, initial encounter: Secondary | ICD-10-CM

## 2022-02-05 DIAGNOSIS — Z681 Body mass index (BMI) 19 or less, adult: Secondary | ICD-10-CM

## 2022-02-05 DIAGNOSIS — L89616 Pressure-induced deep tissue damage of right heel: Secondary | ICD-10-CM | POA: Diagnosis present

## 2022-02-05 DIAGNOSIS — E785 Hyperlipidemia, unspecified: Secondary | ICD-10-CM | POA: Diagnosis present

## 2022-02-05 DIAGNOSIS — E872 Acidosis, unspecified: Secondary | ICD-10-CM | POA: Diagnosis present

## 2022-02-05 DIAGNOSIS — F0150 Vascular dementia without behavioral disturbance: Secondary | ICD-10-CM | POA: Diagnosis present

## 2022-02-05 DIAGNOSIS — J9601 Acute respiratory failure with hypoxia: Secondary | ICD-10-CM | POA: Diagnosis present

## 2022-02-05 DIAGNOSIS — L89626 Pressure-induced deep tissue damage of left heel: Secondary | ICD-10-CM | POA: Diagnosis present

## 2022-02-05 DIAGNOSIS — T501X5A Adverse effect of loop [high-ceiling] diuretics, initial encounter: Secondary | ICD-10-CM | POA: Diagnosis not present

## 2022-02-05 DIAGNOSIS — I214 Non-ST elevation (NSTEMI) myocardial infarction: Secondary | ICD-10-CM | POA: Diagnosis present

## 2022-02-05 DIAGNOSIS — R680 Hypothermia, not associated with low environmental temperature: Secondary | ICD-10-CM | POA: Diagnosis not present

## 2022-02-05 DIAGNOSIS — R159 Full incontinence of feces: Secondary | ICD-10-CM | POA: Diagnosis present

## 2022-02-05 DIAGNOSIS — G9341 Metabolic encephalopathy: Secondary | ICD-10-CM | POA: Diagnosis present

## 2022-02-05 DIAGNOSIS — N1832 Chronic kidney disease, stage 3b: Secondary | ICD-10-CM | POA: Diagnosis present

## 2022-02-05 DIAGNOSIS — I441 Atrioventricular block, second degree: Secondary | ICD-10-CM | POA: Diagnosis present

## 2022-02-05 DIAGNOSIS — E876 Hypokalemia: Secondary | ICD-10-CM | POA: Diagnosis present

## 2022-02-05 DIAGNOSIS — H9190 Unspecified hearing loss, unspecified ear: Secondary | ICD-10-CM | POA: Diagnosis present

## 2022-02-05 DIAGNOSIS — E875 Hyperkalemia: Secondary | ICD-10-CM | POA: Diagnosis present

## 2022-02-05 DIAGNOSIS — Y92230 Patient room in hospital as the place of occurrence of the external cause: Secondary | ICD-10-CM | POA: Diagnosis not present

## 2022-02-05 DIAGNOSIS — R627 Adult failure to thrive: Secondary | ICD-10-CM | POA: Diagnosis present

## 2022-02-05 DIAGNOSIS — Z79899 Other long term (current) drug therapy: Secondary | ICD-10-CM

## 2022-02-05 DIAGNOSIS — Z87440 Personal history of urinary (tract) infections: Secondary | ICD-10-CM

## 2022-02-05 DIAGNOSIS — D696 Thrombocytopenia, unspecified: Secondary | ICD-10-CM | POA: Diagnosis present

## 2022-02-05 DIAGNOSIS — Z87891 Personal history of nicotine dependence: Secondary | ICD-10-CM

## 2022-02-05 DIAGNOSIS — Z8673 Personal history of transient ischemic attack (TIA), and cerebral infarction without residual deficits: Secondary | ICD-10-CM

## 2022-02-05 DIAGNOSIS — R32 Unspecified urinary incontinence: Secondary | ICD-10-CM | POA: Diagnosis present

## 2022-02-05 DIAGNOSIS — E43 Unspecified severe protein-calorie malnutrition: Secondary | ICD-10-CM | POA: Diagnosis present

## 2022-02-05 DIAGNOSIS — I5023 Acute on chronic systolic (congestive) heart failure: Secondary | ICD-10-CM

## 2022-02-05 DIAGNOSIS — Z7189 Other specified counseling: Secondary | ICD-10-CM | POA: Diagnosis not present

## 2022-02-05 DIAGNOSIS — R531 Weakness: Secondary | ICD-10-CM | POA: Diagnosis not present

## 2022-02-05 DIAGNOSIS — F01C0 Vascular dementia, severe, without behavioral disturbance, psychotic disturbance, mood disturbance, and anxiety: Secondary | ICD-10-CM | POA: Diagnosis not present

## 2022-02-05 DIAGNOSIS — N170 Acute kidney failure with tubular necrosis: Secondary | ICD-10-CM | POA: Diagnosis not present

## 2022-02-05 DIAGNOSIS — Z515 Encounter for palliative care: Secondary | ICD-10-CM | POA: Diagnosis not present

## 2022-02-05 LAB — CBC WITH DIFFERENTIAL/PLATELET
Abs Immature Granulocytes: 0.02 10*3/uL (ref 0.00–0.07)
Basophils Absolute: 0 10*3/uL (ref 0.0–0.1)
Basophils Relative: 0 %
Eosinophils Absolute: 0 10*3/uL (ref 0.0–0.5)
Eosinophils Relative: 0 %
HCT: 38.6 % — ABNORMAL LOW (ref 39.0–52.0)
Hemoglobin: 12.1 g/dL — ABNORMAL LOW (ref 13.0–17.0)
Immature Granulocytes: 0 %
Lymphocytes Relative: 16 %
Lymphs Abs: 0.9 10*3/uL (ref 0.7–4.0)
MCH: 28.1 pg (ref 26.0–34.0)
MCHC: 31.3 g/dL (ref 30.0–36.0)
MCV: 89.6 fL (ref 80.0–100.0)
Monocytes Absolute: 0.5 10*3/uL (ref 0.1–1.0)
Monocytes Relative: 9 %
Neutro Abs: 4.2 10*3/uL (ref 1.7–7.7)
Neutrophils Relative %: 75 %
Platelets: 112 10*3/uL — ABNORMAL LOW (ref 150–400)
RBC: 4.31 MIL/uL (ref 4.22–5.81)
RDW: 15.1 % (ref 11.5–15.5)
WBC: 5.7 10*3/uL (ref 4.0–10.5)
nRBC: 0 % (ref 0.0–0.2)

## 2022-02-05 LAB — GLUCOSE, CAPILLARY
Glucose-Capillary: 152 mg/dL — ABNORMAL HIGH (ref 70–99)
Glucose-Capillary: 35 mg/dL — CL (ref 70–99)

## 2022-02-05 LAB — COMPREHENSIVE METABOLIC PANEL
ALT: 42 U/L (ref 0–44)
AST: 29 U/L (ref 15–41)
Albumin: 3.3 g/dL — ABNORMAL LOW (ref 3.5–5.0)
Alkaline Phosphatase: 104 U/L (ref 38–126)
Anion gap: 9 (ref 5–15)
BUN: 47 mg/dL — ABNORMAL HIGH (ref 8–23)
CO2: 19 mmol/L — ABNORMAL LOW (ref 22–32)
Calcium: 9.1 mg/dL (ref 8.9–10.3)
Chloride: 110 mmol/L (ref 98–111)
Creatinine, Ser: 2.66 mg/dL — ABNORMAL HIGH (ref 0.61–1.24)
GFR, Estimated: 22 mL/min — ABNORMAL LOW (ref >60)
Glucose, Bld: 89 mg/dL (ref 70–99)
Potassium: 5.2 mmol/L — ABNORMAL HIGH (ref 3.5–5.1)
Sodium: 138 mmol/L (ref 135–145)
Total Bilirubin: 1.6 mg/dL — ABNORMAL HIGH (ref 0.3–1.2)
Total Protein: 7.1 g/dL (ref 6.5–8.1)

## 2022-02-05 LAB — TROPONIN I (HIGH SENSITIVITY)
Troponin I (High Sensitivity): 544 ng/L (ref <18)
Troponin I (High Sensitivity): 417 ng/L (ref <18)
Troponin I (High Sensitivity): 472 ng/L (ref <18)

## 2022-02-05 LAB — URINALYSIS, COMPLETE (UACMP) WITH MICROSCOPIC
Bacteria, UA: NONE SEEN
Bilirubin Urine: NEGATIVE
Glucose, UA: NEGATIVE mg/dL
Ketones, ur: NEGATIVE mg/dL
Leukocytes,Ua: NEGATIVE
Nitrite: NEGATIVE
Protein, ur: 100 mg/dL — AB
Specific Gravity, Urine: 1.019 (ref 1.005–1.030)
pH: 5 (ref 5.0–8.0)

## 2022-02-05 LAB — LACTIC ACID, PLASMA
Lactic Acid, Venous: 2.3 mmol/L (ref 0.5–1.9)
Lactic Acid, Venous: 3.4 mmol/L (ref 0.5–1.9)

## 2022-02-05 LAB — BRAIN NATRIURETIC PEPTIDE: B Natriuretic Peptide: 4451.8 pg/mL — ABNORMAL HIGH (ref 0.0–100.0)

## 2022-02-05 MED ORDER — ATORVASTATIN CALCIUM 20 MG PO TABS
40.0000 mg | ORAL_TABLET | Freq: Every day | ORAL | Status: DC
Start: 2022-02-05 — End: 2022-02-11
  Administered 2022-02-05 – 2022-02-11 (×7): 40 mg via ORAL
  Filled 2022-02-05 (×7): qty 2

## 2022-02-05 MED ORDER — SODIUM CHLORIDE 0.9 % IV BOLUS
500.0000 mL | Freq: Once | INTRAVENOUS | Status: AC
  Administered 2022-02-05: 500 mL via INTRAVENOUS

## 2022-02-05 MED ORDER — DEXTROSE 50 % IV SOLN
INTRAVENOUS | Status: AC
  Administered 2022-02-05: 50 mL
  Filled 2022-02-05: qty 50

## 2022-02-05 MED ORDER — SODIUM CHLORIDE 0.9 % IV BOLUS
1000.0000 mL | Freq: Once | INTRAVENOUS | Status: AC
  Administered 2022-02-05: 1000 mL via INTRAVENOUS

## 2022-02-05 MED ORDER — CHLORHEXIDINE GLUCONATE CLOTH 2 % EX PADS
6.0000 | MEDICATED_PAD | Freq: Every day | CUTANEOUS | Status: DC
  Administered 2022-02-05 – 2022-02-10 (×5): 6 via TOPICAL
  Filled 2022-02-05: qty 6

## 2022-02-05 MED ORDER — SODIUM ZIRCONIUM CYCLOSILICATE 5 G PO PACK
10.0000 g | PACK | Freq: Two times a day (BID) | ORAL | Status: AC
  Administered 2022-02-05 – 2022-02-06 (×2): 10 g via ORAL
  Filled 2022-02-05: qty 1
  Filled 2022-02-05: qty 2

## 2022-02-05 MED ORDER — HEPARIN SODIUM (PORCINE) 5000 UNIT/ML IJ SOLN
5000.0000 [IU] | Freq: Three times a day (TID) | INTRAMUSCULAR | Status: DC
  Administered 2022-02-05 – 2022-02-06 (×2): 5000 [IU] via SUBCUTANEOUS
  Filled 2022-02-05 (×2): qty 1

## 2022-02-05 MED ORDER — SODIUM CHLORIDE 0.9 % IV SOLN
INTRAVENOUS | Status: DC
Start: 2022-02-05 — End: 2022-02-06

## 2022-02-05 MED ORDER — ASPIRIN 325 MG PO TABS
325.0000 mg | ORAL_TABLET | Freq: Every day | ORAL | Status: DC
  Filled 2022-02-05: qty 1

## 2022-02-05 NOTE — Progress Notes (Signed)
Hypoglycemic Event  CBG: 63  Treatment: D50 25 mL (12.5 gm)  Symptoms:  drowsy  Follow-up CBG: Time:2300 CBG Result:152  Possible Reasons for Event: Inadequate meal intake  Comments/MD notified:n/a    Joanne Gavel

## 2022-02-05 NOTE — Plan of Care (Signed)
  Problem: Education: Goal: Knowledge of General Education information will improve Description: Including pain rating scale, medication(s)/side effects and non-pharmacologic comfort measures Outcome: Not Progressing Note: Unable to complete profile due to confusion.

## 2022-02-05 NOTE — H&P (Addendum)
History and Physical  Austin Fleming ZOX:[id number] DOB: 11-Feb-1926 DOA: 02/05/2022  Referring physician: Dr. Cherylann Banas, [place]  PCP: Leonel Ramsay, MD  Outpatient Specialists: None. Patient coming from: Home, lives with his son.  Chief Complaint: Generalized weakness, altered mental status.  HPI: Austin Fleming is a 88 y.o. male with medical history significant for dementia on Aricept, CVA, reported history of intracranial bleed this year, taken off antiplatelets, Mobitz type I second-degree AV block, HFpEF 55 to 81%, grade 1 diastolic dysfunction, ambulatory dysfunction uses a walker at baseline, incontinent to stool and urine, CKD 3B who presented to [facility] ED with complaints of generalized weakness associated with altered mental status for the last 2 days acutely worsening today to the point where he was unable to move around as he normally does.  More confused.  Denies having any pain.  No chest pain.  No cough.  No dyspnea.  No reported subjective fevers or chills.  The patient was brought into the ED for further evaluation.  Upon assessment in the ED, work-up reveals elevated troponin 544, Mobitz type II on twelve-lead EKG, elevated creatinine 2.6 from baseline of 1.8, lactic acid 2.3.  EDP consulted cardiology who recommended trending high-sensitivity troponin and holding off anticoagulation at this time due to recent intracranial bleed.  TRH, hospitalist team, was asked to admit.  ED Course:  Tmax 97.6.  BP 190/78, pulse 73, respiration rate 16, saturation 95% on room air.  Lab studies remarkable for serum potassium 5.2, bicarb 19, BUN 47, creatinine 2.66, albumin 3.3, T. bili 1.6, GFR 22.  High-sensitivity troponin 544.  Hemoglobin 12.1, platelet count 112.  Review of Systems: Review of systems as noted in the HPI. All other systems reviewed and are negative.  Past medical history: Dementia CVA Mobitz type I second-degree AV block HFpEF 55 to 19%, grade 1 diastolic dysfunction CKD  3B  Past surgical history: None  Social History:  reports that he has quit smoking. He has been exposed to tobacco smoke. He has quit using smokeless tobacco. He reports that he does not drink alcohol and does not use drugs.   No Known Allergies  Family history: The patient is unable to provide due to altered mental status.  Prior to Admission medications   Medication Sig Start Date End Date Taking? Authorizing Provider  acetaminophen (TYLENOL) 500 MG tablet Take 500 mg by mouth every 6 (six) hours as needed.   Yes [provider]  aspirin 325 MG tablet Take 325 mg by mouth daily.   Yes [provider]  atorvastatin (LIPITOR) 40 MG tablet Take 40 mg by mouth daily. 10/26/21  Yes [provider]  Cholecalciferol 25 MCG (1000 UT) capsule Take 1,000 Units by mouth daily.   Yes [provider]  cyanocobalamin 1000 MCG tablet Take 1,000 mcg by mouth daily.   Yes [provider]  donepezil (ARICEPT) 5 MG tablet Take 5 mg by mouth at bedtime. 01/21/22  Yes [provider]  mirtazapine (REMERON) 7.5 MG tablet Take 7.5 mg by mouth at bedtime. 01/21/22  Yes [provider]  Misc Natural Products (OSTEO BI-FLEX ADV TRIPLE ST PO) Take 1 tablet by mouth daily.   Yes [provider]  atorvastatin (LIPITOR) 20 MG tablet Take 4 tablets (80 mg total) by mouth at bedtime. Patient not taking: Reported on 02/05/2022 02/21/21   Geradine Girt, DO    Physical Exam: BP 131/85   Pulse 78   Temp 97.6 F (36.4 C) (Oral)  Resp 20   Ht 5\' 7"  (1.702 m)   Wt 72.6 kg   SpO2 95%   BMI 25.06 kg/m   General: 88 y.o. year-old male well developed well nourished in no acute distress.  Somnolent but easily arousable to voices.  Confused. Cardiovascular: Regular rate and rhythm with no rubs or gallops.  No thyromegaly or JVD noted.  2+ pitting edema lower extremities bilaterally.   Respiratory: Clear to auscultation with no wheezes or rales.   Poor inspiratory effort. Abdomen: Soft nontender nondistended with normal bowel sounds x4 quadrants. Muskuloskeletal: No cyanosis or clubbing.  2+ pitting edema in lower extremities bilaterally. Neuro: CN II-XII intact, strength, sensation, reflexes Skin: No ulcerative lesions noted or rashes Psychiatry: Judgement and insight appear altered. Mood is appropriate for condition and setting.          Labs on Admission:  Basic Metabolic Panel: Recent Labs  Lab 02/05/22 1344  NA 138  K 5.2*  CL 110  CO2 19*  GLUCOSE 89  BUN 47*  CREATININE 2.66*  CALCIUM 9.1   Liver Function Tests: Recent Labs  Lab 02/05/22 1344  AST 29  ALT 42  ALKPHOS 104  BILITOT 1.6*  PROT 7.1  ALBUMIN 3.3*   No results for input(s): LIPASE, AMYLASE in the last 168 hours. No results for input(s): AMMONIA in the last 168 hours. CBC: Recent Labs  Lab 02/05/22 1344  WBC 5.7  NEUTROABS 4.2  HGB 12.1*  HCT 38.6*  MCV 89.6  PLT 112*   Cardiac Enzymes: No results for input(s): CKTOTAL, CKMB, CKMBINDEX, TROPONINI in the last 168 hours.  BNP (last 3 results) No results for input(s): BNP in the last 8760 hours.  ProBNP (last 3 results) No results for input(s): PROBNP in the last 8760 hours.  CBG: No results for input(s): GLUCAP in the last 168 hours.  Radiological Exams on Admission: CT Head Wo Contrast  Result Date: 02/05/2022 CLINICAL DATA:  Mental status change, unknown cause EXAM: CT HEAD WITHOUT CONTRAST TECHNIQUE: Contiguous axial images were obtained from the base of the skull through the vertex without intravenous contrast. RADIATION DOSE REDUCTION: This exam was performed according to the departmental dose-optimization program which includes automated exposure control, adjustment of the mA and/or kV according to patient size and/or use of iterative reconstruction technique. COMPARISON:  02/19/2021 FINDINGS: Brain: There is no acute intracranial hemorrhage, mass effect, or edema. Gray-white  differentiation is preserved. There is no extra-axial fluid collection. Confluent areas of hypoattenuation in the supratentorial white matter nonspecific but may reflect advanced chronic microvascular ischemic changes. Prominence of the ventricles and sulci reflects parenchymal volume loss. Findings are similar to the prior study. Vascular: There is atherosclerotic calcification at the skull base. Skull: Calvarium is unremarkable. Sinuses/Orbits: Chronically opacified right frontal sinus. Lobular patchy mucosal thickening. Orbits are unremarkable. Other: Mastoid air cells are clear. IMPRESSION: No acute intracranial abnormality. Advanced chronic microvascular ischemic changes. Electronically Signed   By: Macy Mis M.D.   On: 02/05/2022 14:32    EKG: I independently viewed the EKG done and my findings are as followed: Second-degree AV block, Mobitz 2, rate of 90.  QTc 474.  Assessment/Plan Present on Admission: **None**  Principal Problem:   Generalized weakness  Generalized weakness, suspect multifactorial Secondary to acute illness, elevated troponin, prerenal AKI Treat underlying conditions Follow UA and urine culture which are pending. PT OT to assess once hemodynamically stable and okay from cardiology. Fall precautions.  Elevated troponin No reported anginal symptoms. Second degree AV  block Mobitz 2 on 12-lead EKG Ordered 2D echo, fasting lipid panel Cycle high-sensitivity troponin x3 Resume home statin.  Home aspirin was stopped prior to presentation due to recent intracranial bleed, per patient's daughter at bedside. N.p.o. until seen by cardiology. Cardiology consulted by EDP.  Per cardiology hold off on heparin drip for now due to reported history of recent intracranial bleed. We will obtain BNP and chest x-ray.  Acute metabolic encephalopathy in the setting of dementia Likely multifactorial secondary to acute illness, dehydration, possibly others. Reorient as  needed Fall/aspiration/delirium precautions Continue to treat underlying conditions If UA positive start antibiotics  Second-degree AV block Mobitz type II. Hold off home Aricept Hold off any AV nodal blocking agent Closely monitor on telemetry.  AKI on CKD 3B Baseline creatinine appears to be 1.8 with GFR of 33. Presented with creatinine of 2.66 with GFR 22 Gentle IV fluid hydration started normal saline at 50 cc/h x 1 day. Closely monitor volume status while on IV fluid hydration Closely monitor urine output with strict I's and O's. Avoid nephrotoxic agents, dehydration and hypotension Repeat renal panel in the morning  Isolated elevated T bilirubin Hemoglobin 1.6 Nonspecific Repeat chemistry panel  Bilateral lower extremity edema Rule out DVT Bilateral lower extremity Doppler ultrasound ordered Follow BNP, chest x-ray, and 2D echo to rule out volume overload/acute CHF  Hyperlipidemia Resume home Lipitor 40 mg daily.  Dementia Hold off on Aricept as it can cause heart block.  Moderate protein calorie malnutrition Per patient's daughter at bedside he is on a soft diet due to missing teeth She states he recently passed a swallow evaluation. Start soft diet when okay with cardiology or when high-sensitivity troponins are downtrending.    Critical care time: 65 minutes.     DVT prophylaxis: Subcu heparin 3 times daily.  Code Status: Full code.  Family Communication: Daughter and son-in-law at bedside.  Disposition Plan: Admitted to stepdown unit.  Consults called: Cardiology consulted.  Admission status: Inpatient status.   Status is: Inpatient Patient requires at least 2 midnights for further evaluation and treatment of present condition.   Kayleen Memos MD [facility] Pager [phone]  If 7PM-7AM, please contact night-coverage [email or web address] Password [email or web address]  02/05/2022, 4:19 PM

## 2022-02-05 NOTE — ED Provider Notes (Signed)
[facility] Provider Note    Event Date/Time   First MD Initiated Contact with Patient 02/05/22 1250     (approximate)   History   Altered Mental Status (Patient last seem normal at 5:30pm yesterday, increasingly confused, weakness not able to get up, normally ambulates with walker and have normal conversations.)   HPI  Austin Fleming is a 88 y.o. male with a history of CKD, CVA, possible CAA who presents with altered mental status for the last 2 to 3 days, acutely worsened today, and associated with generalized weakness.  The patient himself has no acute complaints and denies any pain.  He denies any fever, vomiting, shortness of breath, chest pain, or other focal symptoms.  The family reports that he has had similar presentations previously with UTIs.   Physical Exam   Triage Vital Signs: ED Triage Vitals  Enc Vitals Group     BP 02/05/22 1253 (!) 133/105     Pulse Rate 02/05/22 1253 85     Resp 02/05/22 1253 15     Temp 02/05/22 1253 97.6 F (36.4 C)     Temp Source 02/05/22 1253 Oral     SpO2 02/05/22 1253 100 %     Weight 02/05/22 1310 160 lb (72.6 kg)     Height 02/05/22 1310 5\' 7"  (1.702 m)     Head Circumference --      Peak Flow --      Pain Score --      Pain Loc --      Pain Edu? --      Excl. in [place]? --     Most recent vital signs: Vitals:   02/05/22 1400 02/05/22 1547  BP: (!) 148/118 131/85  Pulse: 89 78  Resp: (!) 26 20  Temp:    SpO2: 100% 95%     General: Weak appearing but alert, oriented x2 CV:  Good peripheral perfusion.  Normal heart sounds. Resp:  Normal effort.  Lungs CTAB. Abd:  Soft and nontender.  No distention.  Other:  Dry mucous membranes.  EOMI.  PERRLA.  No facial droop.  Motor intact in all extremities.   ED Results / Procedures / Treatments   Labs (all labs ordered are listed, but only abnormal results are displayed) Labs Reviewed  COMPREHENSIVE METABOLIC PANEL - Abnormal; Notable for the following  components:      Result Value   Potassium 5.2 (*)    CO2 19 (*)    BUN 47 (*)    Creatinine, Ser 2.66 (*)    Albumin 3.3 (*)    Total Bilirubin 1.6 (*)    GFR, Estimated 22 (*)    All other components within normal limits  CBC WITH DIFFERENTIAL/PLATELET - Abnormal; Notable for the following components:   Hemoglobin 12.1 (*)    HCT 38.6 (*)    Platelets 112 (*)    All other components within normal limits  LACTIC ACID, PLASMA - Abnormal; Notable for the following components:   Lactic Acid, Venous 2.3 (*)    All other components within normal limits  TROPONIN I (HIGH SENSITIVITY) - Abnormal; Notable for the following components:   Troponin I (High Sensitivity) 544 (*)    All other components within normal limits  URINE CULTURE  LACTIC ACID, PLASMA  URINALYSIS, ROUTINE W REFLEX MICROSCOPIC  URINALYSIS, COMPLETE (UACMP) WITH MICROSCOPIC     EKG  ED ECG REPORT I, Arta Silence, the attending physician, personally viewed and interpreted this  ECG.  Date: 02/05/2022 EKG Time: 1314 Rate: 90 Rhythm: Second-degree block Mobitz type I (incorrectly read by machine is Mobitz type II) QRS Axis: normal Intervals: LAFB ST/T Wave abnormalities: Nonspecific abnormalities Narrative Interpretation: Second-degree heart block with no evidence of acute ischemia    RADIOLOGY  CT head: I independently viewed and interpreted the images; there is no ICH or other acute abnormality  PROCEDURES:  Critical Care performed: No  Procedures   MEDICATIONS ORDERED IN ED: Medications  heparin injection 5,000 Units (has no administration in time range)  sodium zirconium cyclosilicate (LOKELMA) packet 10 g (has no administration in time range)  aspirin tablet 325 mg (has no administration in time range)  atorvastatin (LIPITOR) tablet 40 mg (has no administration in time range)  0.9 %  sodium chloride infusion (has no administration in time range)  sodium chloride 0.9 % bolus 500 mL (0 mLs  Intravenous Stopped 02/05/22 1530)  sodium chloride 0.9 % bolus 1,000 mL (1,000 mLs Intravenous New Bag/Given 02/05/22 1503)     IMPRESSION / MDM / ASSESSMENT AND PLAN / ED COURSE  I reviewed the triage vital signs and the nursing notes.  88 year old male with PMH as noted above presents with altered mental status and generalized weakness for the last several days.  I reviewed the past medical records.  The patient was most recently admitted in June of last year.  Per the hospitalist discharge summary from 02/23/2021 he presented with syncope and there was concern for brainstem hypoperfusion and posterior circulation strokes.  MRI was concerning for CAA.  He is on aspirin but was not put on additional antiplatelet agents due to the CAA.  On exam currently, the patient is somewhat weak appearing but alert and in no distress.  His vital signs are normal.  Neurologic exam is nonfocal.  Mucous membranes appear very dry.  EKG shows what appears to be a Mobitz type I second-degree heart block.  During the patient's previous admission he had some dropped beats and there was concern for possible heart block but this did not require immediate intervention.  Differential diagnosis includes, but is not limited to, dehydration, electrolyte abnormality, AKI, other metabolic cause, acute infection/sepsis, primary CNS cause such as recurrent stroke, progression of dementia, or less likely primary cardiac cause.  Patient's presentation is most consistent with acute presentation with potential threat to life or bodily function.  We will obtain CT head, lab work-up, give a fluid bolus, and reassess.  The patient is on the cardiac monitor to evaluate for evidence of arrhythmia and/or significant heart rate changes.  ----------------------------------------- 3:45 PM on 02/05/2022 -----------------------------------------  CT head is negative for acute findings.  Lab work-up reveals mildly elevated creatinine  from baseline and an elevated lactate.  I have ordered additional fluid beyond the initial 500 mL.  The troponin is elevated to 544.  The patient is not having any chest pain or difficulty breathing.  EKG as mentioned above shows possible Mobitz 1 heart block.  I consulted Dr. Saralyn Pilar from cardiology and discussed the patient with him.  He does not recommend acute intervention.  He would not anticoagulate the patient for possible NSTEMI given the CAA, and does not recommend any acute intervention for the heart block.  However the patient will need further monitoring and treatment, and the urinalysis is still pending.  I consulted Dr. Nevada Crane from the hospitalist service; based on our discussion she agrees to admit the patient   FINAL CLINICAL IMPRESSION(S) / ED DIAGNOSES  Final diagnoses:  NSTEMI (non-ST elevated myocardial infarction) ([place])  Second degree Mobitz I AV block  Generalized weakness     Rx / DC Orders   ED Discharge Orders     None        Note:  This document was prepared using Dragon voice recognition software and may include unintentional dictation errors.    Arta Silence, MD 02/05/22 [phone]

## 2022-02-06 ENCOUNTER — Inpatient Hospital Stay
Admit: 2022-02-06 | Discharge: 2022-02-06 | Disposition: A | Discharge status: Home / Self Care | Payer: Medicare Other | Attending: Internal Medicine | Admitting: Internal Medicine

## 2022-02-06 DIAGNOSIS — F01C0 Vascular dementia, severe, without behavioral disturbance, psychotic disturbance, mood disturbance, and anxiety: Secondary | ICD-10-CM

## 2022-02-06 DIAGNOSIS — N1832 Chronic kidney disease, stage 3b: Secondary | ICD-10-CM

## 2022-02-06 DIAGNOSIS — N170 Acute kidney failure with tubular necrosis: Secondary | ICD-10-CM | POA: Diagnosis not present

## 2022-02-06 DIAGNOSIS — F0150 Vascular dementia without behavioral disturbance: Secondary | ICD-10-CM

## 2022-02-06 DIAGNOSIS — E872 Acidosis, unspecified: Secondary | ICD-10-CM

## 2022-02-06 DIAGNOSIS — Z8673 Personal history of transient ischemic attack (TIA), and cerebral infarction without residual deficits: Secondary | ICD-10-CM

## 2022-02-06 DIAGNOSIS — D519 Vitamin B12 deficiency anemia, unspecified: Secondary | ICD-10-CM

## 2022-02-06 DIAGNOSIS — D696 Thrombocytopenia, unspecified: Secondary | ICD-10-CM

## 2022-02-06 DIAGNOSIS — I5023 Acute on chronic systolic (congestive) heart failure: Secondary | ICD-10-CM | POA: Diagnosis not present

## 2022-02-06 DIAGNOSIS — N179 Acute kidney failure, unspecified: Secondary | ICD-10-CM

## 2022-02-06 LAB — CBC WITH DIFFERENTIAL/PLATELET
Abs Immature Granulocytes: 0.03 10*3/uL (ref 0.00–0.07)
Basophils Absolute: 0 10*3/uL (ref 0.0–0.1)
Basophils Relative: 0 %
Eosinophils Absolute: 0 10*3/uL (ref 0.0–0.5)
Eosinophils Relative: 0 %
HCT: 36.3 % — ABNORMAL LOW (ref 39.0–52.0)
Hemoglobin: 11.4 g/dL — ABNORMAL LOW (ref 13.0–17.0)
Immature Granulocytes: 1 %
Lymphocytes Relative: 9 %
Lymphs Abs: 0.5 10*3/uL — ABNORMAL LOW (ref 0.7–4.0)
MCH: 27.9 pg (ref 26.0–34.0)
MCHC: 31.4 g/dL (ref 30.0–36.0)
MCV: 89 fL (ref 80.0–100.0)
Monocytes Absolute: 0.4 10*3/uL (ref 0.1–1.0)
Monocytes Relative: 9 %
Neutro Abs: 4.2 10*3/uL (ref 1.7–7.7)
Neutrophils Relative %: 81 %
Platelets: 95 10*3/uL — ABNORMAL LOW (ref 150–400)
RBC: 4.08 MIL/uL — ABNORMAL LOW (ref 4.22–5.81)
RDW: 15.3 % (ref 11.5–15.5)
WBC: 5.1 10*3/uL (ref 4.0–10.5)
nRBC: 0 % (ref 0.0–0.2)

## 2022-02-06 LAB — ECHOCARDIOGRAM COMPLETE
AR max vel: 1.31 cm2
AV Area VTI: 1.24 cm2
AV Area mean vel: 1.11 cm2
AV Mean grad: 3 mmHg
AV Peak grad: 5.7 mmHg
Ao pk vel: 1.19 m/s
Area-P 1/2: 6.04 cm2
Height: 67 in
MV VTI: 1.89 cm2
S' Lateral: 4.3 cm
Weight: 2278.67 oz

## 2022-02-06 LAB — COMPREHENSIVE METABOLIC PANEL
ALT: 41 U/L (ref 0–44)
AST: 31 U/L (ref 15–41)
Albumin: 2.8 g/dL — ABNORMAL LOW (ref 3.5–5.0)
Alkaline Phosphatase: 95 U/L (ref 38–126)
Anion gap: 9 (ref 5–15)
BUN: 48 mg/dL — ABNORMAL HIGH (ref 8–23)
CO2: 18 mmol/L — ABNORMAL LOW (ref 22–32)
Calcium: 8.7 mg/dL — ABNORMAL LOW (ref 8.9–10.3)
Chloride: 113 mmol/L — ABNORMAL HIGH (ref 98–111)
Creatinine, Ser: 2.56 mg/dL — ABNORMAL HIGH (ref 0.61–1.24)
GFR, Estimated: 23 mL/min — ABNORMAL LOW (ref >60)
Glucose, Bld: 99 mg/dL (ref 70–99)
Potassium: 5.1 mmol/L (ref 3.5–5.1)
Sodium: 140 mmol/L (ref 135–145)
Total Bilirubin: 1.6 mg/dL — ABNORMAL HIGH (ref 0.3–1.2)
Total Protein: 6.3 g/dL — ABNORMAL LOW (ref 6.5–8.1)

## 2022-02-06 LAB — LIPID PANEL
Cholesterol: 145 mg/dL (ref 0–200)
HDL: 42 mg/dL (ref >40)
LDL Cholesterol: 89 mg/dL (ref 0–99)
Total CHOL/HDL Ratio: 3.5 RATIO
Triglycerides: 68 mg/dL (ref <150)
VLDL: 14 mg/dL (ref 0–40)

## 2022-02-06 LAB — URINE CULTURE: Culture: <10000 — AB

## 2022-02-06 LAB — PHOSPHORUS: Phosphorus: 4.7 mg/dL — ABNORMAL HIGH (ref 2.5–4.6)

## 2022-02-06 LAB — GLUCOSE, CAPILLARY
Glucose-Capillary: 23 mg/dL — CL (ref 70–99)
Glucose-Capillary: 63 mg/dL — ABNORMAL LOW (ref 70–99)
Glucose-Capillary: 108 mg/dL — ABNORMAL HIGH (ref 70–99)

## 2022-02-06 LAB — MAGNESIUM: Magnesium: 2.5 mg/dL — ABNORMAL HIGH (ref 1.7–2.4)

## 2022-02-06 LAB — MRSA NEXT GEN BY PCR, NASAL: MRSA by PCR Next Gen: NOT DETECTED

## 2022-02-06 MED ORDER — FUROSEMIDE 10 MG/ML IJ SOLN
40.0000 mg | Freq: Two times a day (BID) | INTRAMUSCULAR | Status: DC
Start: 2022-02-06 — End: 2022-02-07
  Administered 2022-02-06 – 2022-02-07 (×3): 40 mg via INTRAVENOUS
  Filled 2022-02-06 (×3): qty 4

## 2022-02-06 MED ORDER — FUROSEMIDE 10 MG/ML IJ SOLN
20.0000 mg | Freq: Two times a day (BID) | INTRAMUSCULAR | Status: DC
Start: 2022-02-06 — End: 2022-02-06

## 2022-02-06 MED ORDER — CARVEDILOL 3.125 MG PO TABS: 3.1250 mg | ORAL_TABLET | Freq: Two times a day (BID) | ORAL | Status: DC

## 2022-02-06 MED ORDER — VITAMIN B-12 1000 MCG PO TABS
1000.0000 ug | ORAL_TABLET | Freq: Every day | ORAL | Status: DC
  Administered 2022-02-06 – 2022-02-11 (×6): 1000 ug via ORAL
  Filled 2022-02-06 (×6): qty 1

## 2022-02-06 MED ORDER — DONEPEZIL HCL 5 MG PO TABS
5.0000 mg | ORAL_TABLET | Freq: Every day | ORAL | Status: DC
  Administered 2022-02-06 – 2022-02-10 (×4): 5 mg via ORAL
  Filled 2022-02-06 (×5): qty 1

## 2022-02-06 MED ORDER — MIRTAZAPINE 15 MG PO TABS
7.5000 mg | ORAL_TABLET | Freq: Every day | ORAL | Status: DC
  Administered 2022-02-06 – 2022-02-10 (×4): 7.5 mg via ORAL
  Filled 2022-02-06 (×5): qty 1

## 2022-02-06 NOTE — Progress Notes (Signed)
PT Cancellation Note  Patient Details Name: Austin Fleming MRN: [id number] DOB: 08/17/1926   Cancelled Treatment:    Reason Eval/Treat Not Completed: Other (comment). Per MD, pt more appropriate for exertional activity tomorrow. PT to hold for now.   Lieutenant Diego PT, DPT 2:32 PM,02/06/22

## 2022-02-06 NOTE — Progress Notes (Signed)
Patient admitted from emergency room, rectal temperature 35.8 and POCT glucose 63. [employer] banker and administered 25mg  of dextrose. Patient is drowsy, hard of hearing and  confused. No family at the bedside.  Informed [place] NP about lactic results, elevated BNP and IVF orders. No new orders at this time. Patient is not in any respiratory compromise. POCT has improved. No complaints of pain. Patient is confused but is able to answer some questions appropriately.

## 2022-02-06 NOTE — Progress Notes (Signed)
*  PRELIMINARY RESULTS* Echocardiogram 2D Echocardiogram has been performed.  Austin Fleming 02/06/2022, 10:26 AM

## 2022-02-06 NOTE — Plan of Care (Signed)
v

## 2022-02-06 NOTE — Progress Notes (Signed)
OT Cancellation Note  Patient Details Name: Austin Fleming MRN: [id number] DOB: December 12, 1925   Cancelled Treatment:    Reason Eval/Treat Not Completed: Other (comment) (per Dr Roosevelt Locks via secure chat; "tomorrow would be better after iv lasix". OT will hold until tomororw (6/1), will re-attempt as able.Shanon Payor, OTD OTR/L  02/06/22, 2:24 PM

## 2022-02-06 NOTE — Progress Notes (Signed)
PT Cancellation Note  Patient Details Name: Austin Fleming MRN: [id number] DOB: November 12, 1925   Cancelled Treatment:    Reason Eval/Treat Not Completed: Other (comment). Pt with technician at bedside, PT to re-attempt as able.   Lieutenant Diego PT, DPT 9:50 AM,02/06/22

## 2022-02-06 NOTE — Progress Notes (Signed)
  Progress Note   Patient: Austin Fleming DDU:[id number] DOB: [birth year] DOA: 02/05/2022     1 DOS: the patient was seen and examined on 02/06/2022   Brief hospital course: Austin Fleming is a 88 y.o. male with medical history significant for dementia on Aricept, CVA, reported history of intracranial bleed this year, taken off antiplatelets, Mobitz type I second-degree AV block, HFpEF 55 to 23%, grade 1 diastolic dysfunction, ambulatory dysfunction uses a walker at baseline, incontinent to stool and urine, CKD 3B who presented to [facility] ED with complaints of generalized weakness associated with altered mental status for the last 2 days.  Patient had elevated troponin at 472, BNP at 4451.  Chest x-ray showed vascular congestion consistent with CHF.   Assessment and Plan: Acute on chronic systolic congestive heart failure. Elevated troponin secondary to congestive heart failure. Generalized weakness secondary to acute congestive heart failure. Second degree AV block with Wenckebach. Patient has some short of breath today, echocardiogram showed ejection fraction 30 to 35% and grade 1 diastolic dysfunction. Patient has a profound elevation of BNP with elevated troponin. Condition consistent with acute on chronic systolic congestive heart failure. Patient will be started on IV Lasix, hold off beta-blocker due to AV block. I reviewed patient cardiac monitor strips as well as EKG performed admission, consistent with Wenckebach.  Will obtain cardiology opinion. PT/OT.  Chronic vitamin B12 deficient anemia. Mild thrombocytopenia. Patient was on oral B12 as outpatient, will restart. Patient had thrombocytopenia at the time of admission, before the start of any heparin.  Continue to follow.  Vascular dementia. History of stroke. Patient is confused, but not significant agitation.  Continue home medicines.  Acute kidney injury on chronic kidney stage IIIb. Metabolic acidosis. Hyperkalemia. Renal  function is getting better, potassium normalized.  We will start sodium bicarb orally.  CODE STATUS. Talked with patient son and daughter, decision was made patient DO NOT RESUSCITATE status.        Subjective:  Patient is very confused, but no agitation. He appeared to have some shortness of breath with exertion.  No cough.  Physical Exam: Vitals:   02/06/22 0700 02/06/22 0800 02/06/22 0900 02/06/22 1000  BP: (!) 132/93 128/86 (!) 135/111 (!) 145/122  Pulse: (!) 53  78 (!) 48  Resp: 19 (!) 28 14 (!) 30  Temp: 98.2 F (36.8 C) 97.9 F (36.6 C) 98.1 F (36.7 C) 98.1 F (36.7 C)  TempSrc:  Oral    SpO2: 92%  94% 93%  Weight:      Height:       General exam: Appears calm and comfortable  Respiratory system: Significant decreased breathing sounds. Respiratory effort normal. Cardiovascular system: S1 & S2 heard, RRR. No JVD, murmurs, rubs, gallops or clicks. No pedal edema. Gastrointestinal system: Abdomen is nondistended, soft and nontender. No organomegaly or masses felt. Normal bowel sounds heard. Central nervous system: Alert and oriented x1. No focal neurological deficits. Extremities: Symmetric 5 x 5 power. Skin: No rashes, lesions or ulcers   Data Reviewed:  Reviewed echocardiogram results, chest x-ray, all lab results  Family Communication: Daughter and son at bedside.  Disposition: Status is: Inpatient Remains inpatient appropriate because: Severity of disease, IV treatment.  Altered mental status.  Planned Discharge Destination:  To be determined    Time spent: 55 minutes  Author: Sharen Hones, MD 02/06/2022 1:20 PM  For on call review [email or web address].

## 2022-02-07 ENCOUNTER — Other Ambulatory Visit: Payer: Self-pay

## 2022-02-07 ENCOUNTER — Encounter: Payer: Self-pay | Admitting: Internal Medicine

## 2022-02-07 DIAGNOSIS — R531 Weakness: Secondary | ICD-10-CM | POA: Diagnosis not present

## 2022-02-07 DIAGNOSIS — Z515 Encounter for palliative care: Secondary | ICD-10-CM

## 2022-02-07 DIAGNOSIS — E872 Acidosis, unspecified: Secondary | ICD-10-CM | POA: Diagnosis not present

## 2022-02-07 DIAGNOSIS — Z7189 Other specified counseling: Secondary | ICD-10-CM

## 2022-02-07 DIAGNOSIS — Z66 Do not resuscitate: Secondary | ICD-10-CM

## 2022-02-07 DIAGNOSIS — I5023 Acute on chronic systolic (congestive) heart failure: Secondary | ICD-10-CM | POA: Diagnosis not present

## 2022-02-07 DIAGNOSIS — N170 Acute kidney failure with tubular necrosis: Secondary | ICD-10-CM | POA: Diagnosis not present

## 2022-02-07 DIAGNOSIS — F01C0 Vascular dementia, severe, without behavioral disturbance, psychotic disturbance, mood disturbance, and anxiety: Secondary | ICD-10-CM | POA: Diagnosis not present

## 2022-02-07 LAB — BASIC METABOLIC PANEL
Anion gap: 10 (ref 5–15)
BUN: 53 mg/dL — ABNORMAL HIGH (ref 8–23)
CO2: 15 mmol/L — ABNORMAL LOW (ref 22–32)
Calcium: 8.9 mg/dL (ref 8.9–10.3)
Chloride: 115 mmol/L — ABNORMAL HIGH (ref 98–111)
Creatinine, Ser: 2.81 mg/dL — ABNORMAL HIGH (ref 0.61–1.24)
GFR, Estimated: 20 mL/min — ABNORMAL LOW (ref >60)
Glucose, Bld: 85 mg/dL (ref 70–99)
Potassium: 4.5 mmol/L (ref 3.5–5.1)
Sodium: 140 mmol/L (ref 135–145)

## 2022-02-07 LAB — MAGNESIUM: Magnesium: 2.6 mg/dL — ABNORMAL HIGH (ref 1.7–2.4)

## 2022-02-07 LAB — BRAIN NATRIURETIC PEPTIDE: B Natriuretic Peptide: >4500 pg/mL — ABNORMAL HIGH (ref 0.0–100.0)

## 2022-02-07 MED ORDER — ORAL CARE MOUTH RINSE
15.0000 mL | Freq: Two times a day (BID) | OROMUCOSAL | Status: DC
  Administered 2022-02-07 – 2022-02-11 (×9): 15 mL via OROMUCOSAL

## 2022-02-07 MED ORDER — MELATONIN 5 MG PO TABS
5.0000 mg | ORAL_TABLET | Freq: Every day | ORAL | Status: DC
  Administered 2022-02-08 – 2022-02-10 (×3): 5 mg via ORAL
  Filled 2022-02-07 (×4): qty 1

## 2022-02-07 MED ORDER — ALBUMIN HUMAN 25 % IV SOLN
25.0000 g | Freq: Once | INTRAVENOUS | Status: AC
  Administered 2022-02-07: 25 g via INTRAVENOUS
  Filled 2022-02-07: qty 100

## 2022-02-07 MED ORDER — QUETIAPINE FUMARATE 25 MG PO TABS
25.0000 mg | ORAL_TABLET | Freq: Every day | ORAL | Status: DC
  Administered 2022-02-08 – 2022-02-10 (×3): 25 mg via ORAL
  Filled 2022-02-07 (×4): qty 1

## 2022-02-07 MED ORDER — FUROSEMIDE 10 MG/ML IJ SOLN
6.0000 mg/h | INTRAVENOUS | Status: DC
  Administered 2022-02-07 – 2022-02-08 (×2): 6 mg/h via INTRAVENOUS
  Filled 2022-02-07 (×2): qty 20

## 2022-02-07 MED ORDER — SODIUM BICARBONATE 650 MG PO TABS
1300.0000 mg | ORAL_TABLET | Freq: Three times a day (TID) | ORAL | Status: DC
  Administered 2022-02-08: 1300 mg via ORAL
  Filled 2022-02-07 (×4): qty 2

## 2022-02-07 NOTE — Evaluation (Signed)
Physical Therapy Evaluation Patient Details Name: Austin Fleming MRN: [id number] DOB: 1925-12-08 Today's Date: 02/07/2022  History of Present Illness  Pt is a 88 yo male that presented for increased weakness, AMS. Workup showed possible heart block, with no acute intervention per cardiology, acute on chronic CHF. PMH of CKD, CVA, CAA, dementia, intracranial bleed.   Clinical Impression  Pt oriented to self only, no pain signs/symptoms noted throughout session. Family in room reported at baseline he is ambulatory with a RW but has had a decline in the last couple of weeks with mobility and PO intake.   The patient was able to follow all simple commands, intermittently with repetition. Some perseveration noted in conversation. He was able to lift all extremities against gravity, and did a few LAQ seated EOB. Bed mobility with minA and extended time, fair sitting balance though pt preferred bilateral UE propping. Sit <> stand three times with mod-maxA with RW. Ultimately 2 person assist to stand pivot to recliner, maxA-totalAx2. He was able to take one step forward with his LLE.  Overall the patient demonstrated deficits (see "PT Problem List") that impede the patient's functional abilities, safety, and mobility and would benefit from skilled PT intervention. Recommendation at this time is SNF due to current level of assistance needed and decline from PLOF.   Of note, unable to get an accurate reading of pt's spO2 with personal pulse ox or dynamap. Placed on 2L, RN notified, as well as elevated BP.        Recommendations for follow up therapy are one component of a multi-disciplinary discharge planning process, led by the attending physician.  Recommendations may be updated based on patient status, additional functional criteria and insurance authorization.  Follow Up Recommendations Skilled nursing-short term rehab (<3 hours/day)    Assistance Recommended at Discharge Frequent or constant  Supervision/Assistance  Patient can return home with the following  Two people to help with walking and/or transfers;Two people to help with bathing/dressing/bathroom;Direct supervision/assist for medications management;Help with stairs or ramp for entrance;Assist for transportation;Assistance with feeding;Direct supervision/assist for financial management;Assistance with cooking/housework    Equipment Recommendations Other (comment) (TBD at next venue of care)  Recommendations for Other Services       Functional Status Assessment Patient has had a recent decline in their functional status and demonstrates the ability to make significant improvements in function in a reasonable and predictable amount of time.     Precautions / Restrictions Precautions Precautions: Fall Restrictions Weight Bearing Restrictions: No      Mobility  Bed Mobility Overal bed mobility: Needs Assistance Bed Mobility: Supine to Sit     Supine to sit: Min assist, HOB elevated     General bed mobility comments: extra time, repetitive cueing    Transfers Overall transfer level: Needs assistance Equipment used: Rolling walker (2 wheels) Transfers: Sit to/from Stand, Bed to chair/wheelchair/BSC Sit to Stand: Mod assist, Max assist   Step pivot transfers: Max assist, Total assist, +2 physical assistance            Ambulation/Gait               General Gait Details: unable at this time  Stairs            Wheelchair Mobility    Modified Rankin (Stroke Patients Only)       Balance Overall balance assessment: Needs assistance Sitting-balance support: Feet supported, Bilateral upper extremity supported Sitting balance-Leahy Scale: Fair     Standing balance support: During  functional activity, Reliant on assistive device for balance Standing balance-Leahy Scale: Poor Standing balance comment: poor\ to zero balance                             Pertinent Vitals/Pain  Pain Assessment Pain Assessment: Faces Faces Pain Scale: No hurt    Home Living Family/patient expects to be discharged to:: Private residence Living Arrangements: Children Available Help at Discharge: Family;Available PRN/intermittently Type of Home: House Home Access: Stairs to enter   [employer] of Steps: 4   Home Layout: One level Home Equipment: Conservation officer, nature (2 wheels)      Prior Function               Mobility Comments: pt poor historian, but family reported he has been ambulator with RW, but has been experiencing a decline for a couple weeks now       Hand Dominance        Extremity/Trunk Assessment   Upper Extremity Assessment Upper Extremity Assessment: Generalized weakness;Difficult to assess due to impaired cognition (able to move against gravity)    Lower Extremity Assessment Lower Extremity Assessment: Generalized weakness;Difficult to assess due to impaired cognition (able to move against gravity)    Cervical / Trunk Assessment Cervical / Trunk Assessment: Kyphotic  Communication      Cognition Arousal/Alertness: Awake/alert Behavior During Therapy: Flat affect Overall Cognitive Status: History of cognitive impairments - at baseline                                 General Comments: pt oriented to self and birthday only. does follow all simple commands, sometimes with repetition        General Comments      Exercises     Assessment/Plan    PT Assessment Patient needs continued PT services  PT Problem List Decreased strength;Decreased mobility;Decreased activity tolerance;Decreased balance;Decreased knowledge of use of DME;Decreased safety awareness       PT Treatment Interventions DME instruction;Therapeutic exercise;Gait training;Balance training;Stair training;Neuromuscular re-education;Functional mobility training;Therapeutic activities;Patient/family education    PT Goals (Current goals can be found in  the Care Plan section)  Acute Rehab PT Goals Patient Stated Goal: for pt to be comfortable PT Goal Formulation: With family Time For Goal Achievement: 02/21/22 Potential to Achieve Goals: Fair    Frequency Min 2X/week     Co-evaluation               AM-PAC PT "6 Clicks" Mobility  Outcome Measure Help needed turning from your back to your side while in a flat bed without using bedrails?: A Little Help needed moving from lying on your back to sitting on the side of a flat bed without using bedrails?: A Little Help needed moving to and from a bed to a chair (including a wheelchair)?: Total Help needed standing up from a chair using your arms (e.g., wheelchair or bedside chair)?: A Lot Help needed to walk in hospital room?: Total Help needed climbing 3-5 steps with a railing? : Total 6 Click Score: 11    End of Session Equipment Utilized During Treatment: Gait belt Activity Tolerance: Patient limited by fatigue Patient left: in chair;with chair alarm set;with call bell/phone within reach Nurse Communication: Mobility status PT Visit Diagnosis: Other abnormalities of gait and mobility (R26.89);Difficulty in walking, not elsewhere classified (R26.2);Muscle weakness (generalized) (M62.81)    Time: 3149-7026 PT Time  Calculation (min) (ACUTE ONLY): 33 min   Charges:   PT Evaluation $PT Eval Moderate Complexity: 1 Mod PT Treatments $Therapeutic Activity: 23-37 mins        Lieutenant Diego PT, DPT 10:18 AM,02/07/22

## 2022-02-07 NOTE — Progress Notes (Signed)
Notified provider regarding patient's hypertension, which is consistent with BP's from today. 120-130s/90-110s. No PRN BP medication ordered at this time. Continue to monitor.

## 2022-02-07 NOTE — TOC Initial Note (Addendum)
Transition of Care [facility]) - Initial/Assessment Note    Patient Details  Name: Austin Fleming MRN: [id number] Date of Birth: 15-May-1926  Transition of Care [facility]) CM/SW Contact:    Candie Chroman, LCSW Phone Number: 02/07/2022, 1:18 PM  Clinical Narrative: Patient's daughter is his legal guardian. Paperwork is scanned into chart. CSW called daughter, introduced role, and explained that PT recommendations would be discussed. Daughter needs to talk with two other people before making decision on SNF. She will call CSW back once she has done so. No further concerns. CSW encouraged patient's daughter to contact CSW as needed. CSW will continue to follow patient and his daughter for support and facilitate discharge once medically stable.                 2:55 pm: Received call back from daughter. She is agreeable to SNF. Open to [facility] and [facility] options. Patient does not have COVID vaccines.  Expected Discharge Plan: Skilled Nursing Facility Barriers to Discharge: Continued Medical Work up   Patient Goals and CMS Choice        Expected Discharge Plan and Services Expected Discharge Plan: [place] Acute Care Choice:  (TBD) Living arrangements for the past 2 months: Single Family Home                                      Prior Living Arrangements/Services Living arrangements for the past 2 months: Single Family Home Lives with:: Adult Children Patient language and need for interpreter reviewed:: Yes Do you feel safe going back to the place where you live?: Yes      Need for Family Participation in Patient Care: Yes (Comment) Care giver support system in place?: Yes (comment)   Criminal Activity/Legal Involvement Pertinent to Current Situation/Hospitalization: No - Comment as needed  Activities of Daily Living      Permission Sought/Granted Permission sought to share information with : Family Supports    Share Information with NAME:  Alvie Heidelberg     Permission granted to share info w Relationship: Daughter/legal guardian  Permission granted to share info w Contact Information: [phone]  Emotional Assessment Appearance:: Appears stated age Attitude/Demeanor/Rapport: Unable to Assess Affect (typically observed): Unable to Assess Orientation: : Oriented to Self Alcohol / Substance Use: Not Applicable Psych Involvement: No (comment)  Admission diagnosis:  NSTEMI (non-ST elevated myocardial infarction) ([place]) [I21.4] Generalized weakness [R53.1] Second degree Mobitz I AV block [I44.1] Patient Active Problem List   Diagnosis Date Noted   Acute renal failure superimposed on stage 3b chronic kidney disease ([place]) 02/06/2022   Vascular dementia ([place]) 02/06/2022   History of stroke 02/06/2022   Acute on chronic systolic CHF (congestive heart failure) ([place]) 82/95/6213   Metabolic acidosis 08/65/7846   Thrombocytopenia ([place]) 02/06/2022   B12 deficiency anemia 02/06/2022   Generalized weakness 02/05/2022   Normochromic normocytic anemia 03/07/2021   Hyperglycemia 02/26/2021   Mobitz type 1 second degree AV block 02/26/2021   AMS (altered mental status) 02/19/2021   Syncopal episodes 02/19/2021   Dehydration 02/19/2021   CKD (chronic kidney disease), stage IV ([place]) 02/19/2021   Acute CVA (cerebrovascular accident) ([place]) 02/19/2021   PCP:  Leonel Ramsay, MD Pharmacy:   [id number] - [place], [place] - [address] [place] [address] Phone: [phone] Fax: [phone]     Social Determinants of  Health (SDOH) Interventions    Readmission Risk Interventions     View : No data to display.

## 2022-02-07 NOTE — Consult Note (Cosign Needed)
Consultation Note Date: 02/07/2022   Patient Name: Austin Fleming  DOB: June 05, 1926  MRN: [id number]  Age / Sex: 88 y.o., male  PCP: Leonel Ramsay, MD Referring Physician: Sharen Hones, MD  Reason for Consultation: Establishing goals of care  HPI/Patient Profile: 88 y.o. male  with past medical history of dementia on Aricept, CVA, reported history of intracranial bleed this year, taken off antiplatelets, Mobitz type I second-degree AV block, HFpEF 55 to 16%, grade 1 diastolic dysfunction, ambulatory dysfunction uses a walker at baseline, incontinent to stool and urine, and CKD 3B  admitted on 02/05/2022 with generalized weakness and altered mental status.  Found to have acute on chronic systolic heart failure.  Patient continues with significant volume overload and started on Lasix infusion.  Altered mental status also continues.  PMT consulted to discuss goals of care.  Clinical Assessment and Goals of Care: I have reviewed medical records including EPIC notes, labs and imaging, assessed the patient and then met with patient's family including daughter, son, son-in-law, and niece to discuss diagnosis prognosis, GOC, [name], disposition and options.  I introduced Palliative Medicine as specialized medical care for people living with serious illness. It focuses on providing relief from the symptoms and stress of a serious illness. The goal is to improve quality of life for both the patient and the family.  Family shares patient has been living at home but is dependent on his family members for daily cares and has been so for several years.  They share of significant decline over the past week including decline in function, nutrition, and cognition.   We discussed patient's current illness and what it means in the larger context of patient's on-going co-morbidities.  Natural disease trajectory and expectations at EOL were discussed.  We discussed  his heart failure and his kidney failure.  Discussed ongoing altered mental status.  I attempted to elicit values and goals of care important to the patient.    The difference between aggressive medical intervention and comfort care was considered in light of the patient's goals of care.   We discussed that patient appears to be comfortable and not suffering.  Family is interested in continuing current care and allowing time for outcomes.  We discussed that with further decline it may be most appropriate to transition to focus on his comfort.  Family agrees.  We discussed follow-up conversation tomorrow and they are agreeable.  Discussed with family the importance of continued conversation with family and the medical providers regarding overall plan of care and treatment options, ensuring decisions are within the context of the patients values and GOCs.    We discussed hospice care may become most appropriate and family expresses understanding.  They have experience with other family members in [facility] hospice facility and said they had a great experience and would be open to placement there depending on how the next day or 2 goes.  Questions and concerns were addressed. The family was encouraged to call with questions or concerns.  Primary Decision Maker LEGAL GUARDIAN -daughter Alvie Heidelberg    SUMMARY OF RECOMMENDATIONS   Family aware of poor prognosis, discussed potential transition to comfort measures with any further decline Discussed potential involvement of hospice We will follow-up tomorrow and continue goals of care conversations  Code Status/Advance Care Planning: DNR      Primary Diagnoses: Present on Admission: **None**   I have reviewed the medical record, interviewed the patient and family, and examined the patient. The following aspects  are pertinent.  History reviewed. No pertinent past medical history. Social History   Socioeconomic History   Marital  status: Single    Spouse name: Not on file   Number of children: Not on file   Years of education: Not on file   Highest education level: Not on file  Occupational History   Occupation: retired  Tobacco Use   Smoking status: Former    Passive exposure: Past   Smokeless tobacco: Former  Scientific laboratory technician Use: Never used  Substance and Sexual Activity   Alcohol use: Never   Drug use: Never   Sexual activity: Not Currently  Other Topics Concern   Not on file  Social History Narrative   Lives with daughter   Social Determinants of Health   Financial Resource Strain: Not on file  Food Insecurity: Not on file  Transportation Needs: Not on file  Physical Activity: Not on file  Stress: Not on file  Social Connections: Not on file   History reviewed. No pertinent family history. Scheduled Meds:  atorvastatin  40 mg Oral Daily   Chlorhexidine Gluconate Cloth  6 each Topical Q0600   donepezil  5 mg Oral QHS   mouth rinse  15 mL Mouth Rinse BID   mirtazapine  7.5 mg Oral QHS   sodium bicarbonate  1,300 mg Oral TID   cyanocobalamin  1,000 mcg Oral Daily   Continuous Infusions:  furosemide (LASIX) 200 mg in dextrose 5% 100 mL (15m/mL) infusion 6 mg/hr (02/07/22 1505)   PRN Meds:. No Known Allergies Review of Systems  Unable to perform ROS: Mental status change   Physical Exam Constitutional:      General: He is not in acute distress.    Appearance: He is ill-appearing.     Comments: Does not respond to voice or physical touch, family reports he was responsive earlier but sleeping hard now  Pulmonary:     Effort: Pulmonary effort is normal.  Skin:    General: Skin is warm and dry.    Vital Signs: BP (!) 137/98 (BP Location: Right Arm)   Pulse 85   Temp 97.8 F (36.6 C)   Resp 18   Ht '5\' 7"'  (1.702 m)   Wt 66.2 kg   SpO2 100%   BMI 22.86 kg/m  Pain Scale: PAINAD       SpO2: SpO2: 100 % O2 Device:SpO2: 100 % O2 Flow Rate: .O2 Flow Rate (L/min): 2  L/min  IO: Intake/output summary:  Intake/Output Summary (Last 24 hours) at 02/07/2022 1552 Last data filed at 02/07/2022 1505 Gross per 24 hour  Intake 360 ml  Output 900 ml  Net -540 ml    LBM: Last BM Date : 02/06/22 Baseline Weight: Weight: 72.6 kg Most recent weight: Weight: 66.2 kg     Palliative Assessment/Data: PPS 20%    *Please note that this is a verbal dictation therefore any spelling or grammatical errors are due to the "D[employer]" system interpretation.   SJuel Burrow DNP, AGNP-C Palliative Medicine Team [phone]: [phone]

## 2022-02-07 NOTE — Progress Notes (Signed)
  Progress Note   Patient: Austin Fleming QMG:[id number] DOB: 1925/12/03 DOA: 02/05/2022     2 DOS: the patient was seen and examined on 02/07/2022   Brief hospital course: NOLTON DENIS is a 88 y.o. male with medical history significant for dementia on Aricept, CVA, reported history of intracranial bleed this year, taken off antiplatelets, Mobitz type I second-degree AV block, HFpEF 55 to 32%, grade 1 diastolic dysfunction, ambulatory dysfunction uses a walker at baseline, incontinent to stool and urine, CKD 3B who presented to [facility] ED with complaints of generalized weakness associated with altered mental status for the last 2 days.  Patient had elevated troponin at 472, BNP at 4451.  Chest x-ray showed vascular congestion consistent with CHF.     Assessment and Plan: Acute on chronic systolic congestive heart failure. Elevated troponin secondary to congestive heart failure. Generalized weakness secondary to acute congestive heart failure. Second degree AV block with Wenckebach. Patient still has significant volume overload, did not make much urine based on record.  Advised nurse to record I&O more accurately. I will change Lasix to IV drip.  Continue to follow renal function.   Chronic vitamin B12 deficient anemia. Mild thrombocytopenia. Continue follow-up   Vascular dementia. History of stroke. Patient still very confused.  Dementia is quite severe.  Long-term prognosis is poor, will obtain palliative care consult.   Acute kidney injury on chronic kidney stage IIIb. Metabolic acidosis. Hyperkalemia. Increased sodium bicarbonate to 1300 mg 3 times a day.  Continue monitor renal function.       Subjective:  Patient still has significant weakness, not able to walk with physical therapy.  Very confused.  Short of breath with exertion.  Physical Exam: Vitals:   02/07/22 0700 02/07/22 0808 02/07/22 1101 02/07/22 1225  BP: 107/84  (!) 152/108 (!) 137/98  Pulse: 71  92 85  Resp: 18   18 18   Temp:  97.6 F (36.4 C)  97.8 F (36.6 C)  TempSrc:  Axillary    SpO2: 92%  100% 100%  Weight:      Height:       General exam: Appears calm and comfortable  Respiratory system: Decreased breath sounds. Respiratory effort normal. Cardiovascular system: S1 & S2 heard, RRR. No JVD, murmurs, rubs, gallops or clicks. No pedal edema. Gastrointestinal system: Abdomen is nondistended, soft and nontender. No organomegaly or masses felt. Normal bowel sounds heard. Central nervous system: Alert and oriented x1. No focal neurological deficits. Extremities: Symmetric 5 x 5 power. Skin: No rashes, lesions or ulcers   Data Reviewed:  Lab results reviewed.  Family Communication: Daughter updated.  Disposition: Status is: Inpatient Remains inpatient appropriate because: Severity of disease, IV treatment.  Planned Discharge Destination: Skilled nursing facility    Time spent: 40 minutes  Author: Sharen Hones, MD 02/07/2022 2:31 PM  For on call review [email or web address].

## 2022-02-07 NOTE — Care Management Important Message (Signed)
Important Message  Patient Details  Name: Austin Fleming MRN: [id number] Date of Birth: 04-20-1926   Medicare Important Message Given:  N/A - LOS <3 / Initial given by admissions     Dannette Barbara 02/07/2022, 2:56 PM

## 2022-02-07 NOTE — Consult Note (Signed)
[facility] NOTE       Patient ID: Austin Fleming MRN: [id number] DOB/AGE: [birth year] 88 y.o.  Admit date: 02/05/2022 Referring Physician Dr. Roosevelt Locks Primary Physician Dr. Ola Spurr  Primary Cardiologist Dr. 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  Cardiology is consulted because of his EKG showing a 2nd degree AV block.   There is no family present at bedside and the patient is sleeping on his right side upon my arrival into the room.  He briefly opens his eyes when greeted, but does not stay awake long enough to participate.  History is obtained from the chart.  He is normally able to ambulate with a walker and is interactive, family reports for 2 to 3 days prior to admission he was increasingly confused and had similar presentations with prior urinary tract infections.  Apparently he did not complain of any chest pain, shortness of breath, subjective fever or chills.  His EKG on admission showed sinus rhythm with second-degree AV block consistent with Wenckebach, LVH and a rate of 90 bpm.  This is notably similar to his prior EKG from June 2022.  In review of telemetry shows sinus rhythm with second-degree AV block without long sinus pauses or evidence of sick sinus syndrome.  Beta-blockers were held and he was started on IV Lasix due to a significantly elevated BNP and clinical signs of volume overload.  Vitals are notable for a blood pressure this morning of 107/84, SPO2 92% on room air.  Labs on admission are notable for BUN/creatinine 47/2.66, EGFR 22  high-sensitivity troponin elevated but flat trending at [phone], BNP significantly elevated at 4400.  Lactate 2.3-3.4.  H&H 12.1-11.4, 38.6-36.3.  Thrombocytopenia with platelets downtrending 1 12-95.  Chest x-ray with mild increased central vascular congestion with trace right pleural effusion and patchy bibasilar airspace disease, c/w mild CHF  Review of systems unable to assess due to altered mental status and somnolence    History reviewed. No pertinent past medical history.  History reviewed. No pertinent surgical history.  Medications Prior to Admission  Medication Sig Dispense Refill Last Dose   acetaminophen (TYLENOL) 500 MG tablet Take 500 mg by mouth every 6 (six) hours as needed.   02/04/2022   atorvastatin (LIPITOR) 40 MG tablet Take 40 mg by mouth daily.   02/04/2022   Cholecalciferol 25 MCG (1000 UT) capsule Take 1,000 Units by mouth daily.   Past Month   cyanocobalamin 1000 MCG tablet Take 1,000 mcg by mouth daily.   Past Month   donepezil (ARICEPT) 5 MG tablet Take 5 mg by mouth at bedtime.   02/04/2022   mirtazapine (REMERON) 7.5 MG tablet Take 7.5 mg by mouth at bedtime.   02/04/2022   Misc Natural Products (OSTEO BI-FLEX ADV TRIPLE ST PO) Take 1 tablet by mouth daily.   Past Month   Social History   Socioeconomic History   Marital status: Single    Spouse name: Not on file   Number of children: Not on file   Years of education: Not on file   Highest education level: Not on file  Occupational History   Occupation: retired  Tobacco Use   Smoking status: Former    Passive exposure: Past   Smokeless tobacco: Former  Scientific laboratory technician Use: Never used  Substance and Sexual Activity   Alcohol use: Never   Drug use: Never   Sexual activity: Not Currently  Other Topics Concern   Not on file  Social History Narrative   Lives with daughter   Social Determinants of Health   Financial Resource Strain: Not on file  Food Insecurity: Not on file  Transportation  Needs: Not on file  Physical Activity: Not on file  Stress: Not on file  Social Connections: Not on file  Intimate Partner Violence: Not on file    History reviewed. No pertinent family history.    PHYSICAL EXAM General: Elderly and thin black male, in no acute distress.  Lying on his right side sleeping with arms held up to his chest. HEENT:  Normocephalic and atraumatic. Neck:  No JVD.  Lungs: Normal respiratory effort on room air. Clear bilaterally to auscultation. No wheezes, crackles, rhonchi.  Heart: HRRR . Normal S1 and S2 without gallops or murmurs. Radial & DP pulses 2+ bilaterally. Abdomen: Non-distended appearing.  Msk: Moves all extremities spontaneously Extremities: Warm and well perfused. No clubbing, cyanosis.  2+ bilateral lower extremity edema.  Neuro: Briefly awakens to my verbal greeting, but does not interact with me otherwise Psych: Sleeping, unable to assess  Labs:   Lab Results  Component Value Date   WBC 5.1 02/06/2022   HGB 11.4 (L) 02/06/2022   HCT 36.3 (L) 02/06/2022   MCV 89.0 02/06/2022   PLT 95 (L) 02/06/2022    Recent Labs  Lab 02/06/22 0312  NA 140  K 5.1  CL 113*  CO2 18*  BUN 48*  CREATININE 2.56*  CALCIUM 8.7*  PROT 6.3*  BILITOT 1.6*  ALKPHOS 95  ALT 41  AST 31  GLUCOSE 99   No results found for: CKTOTAL, CKMB, CKMBINDEX, TROPONINI  Lab Results  Component Value Date   CHOL 145 02/06/2022   CHOL 220 (H) 02/20/2021   Lab Results  Component Value Date   HDL 42 02/06/2022   HDL 51 02/20/2021   Lab Results  Component Value Date   LDLCALC 89 02/06/2022   LDLCALC 159 (H) 02/20/2021   Lab Results  Component Value Date   TRIG 68 02/06/2022   TRIG 51 02/20/2021   Lab Results  Component Value Date   CHOLHDL 3.5 02/06/2022   CHOLHDL 4.3 02/20/2021   No results found for: LDLDIRECT    Radiology: CT Head Wo Contrast  Result Date: 02/05/2022 CLINICAL DATA:  Mental status change, unknown cause EXAM: CT HEAD WITHOUT  CONTRAST TECHNIQUE: Contiguous axial images were obtained from the base of the skull through the vertex without intravenous contrast. RADIATION DOSE REDUCTION: This exam was performed according to the departmental dose-optimization program which includes automated exposure control, adjustment of the mA and/or kV according to patient size and/or use of iterative reconstruction technique. COMPARISON:  02/19/2021 FINDINGS: Brain: There is no acute intracranial hemorrhage, mass effect, or edema. Gray-white differentiation is preserved. There is no extra-axial fluid collection. Confluent areas of hypoattenuation in the supratentorial white matter nonspecific but may reflect advanced chronic microvascular ischemic changes. Prominence of the ventricles and sulci reflects parenchymal volume loss. Findings are similar to the prior study. Vascular: There is atherosclerotic calcification at the skull base. Skull: Calvarium is unremarkable. Sinuses/Orbits: Chronically opacified right frontal sinus. Lobular  patchy mucosal thickening. Orbits are unremarkable. Other: Mastoid air cells are clear. IMPRESSION: No acute intracranial abnormality. Advanced chronic microvascular ischemic changes. Electronically Signed   By: Macy Mis M.D.   On: 02/05/2022 14:32   US Venous Img Lower Bilateral (DVT)  Result Date: 02/05/2022 CLINICAL DATA:  Bilateral leg edema EXAM: BILATERAL LOWER EXTREMITY VENOUS DOPPLER ULTRASOUND TECHNIQUE: Gray-scale sonography with graded compression, as well as color Doppler and duplex ultrasound were performed to evaluate the lower extremity deep venous systems from the level of the common femoral vein and including the common femoral, femoral, profunda femoral, popliteal and calf veins including the posterior tibial, peroneal and gastrocnemius veins when visible. The superficial great saphenous vein was also interrogated. Spectral Doppler was utilized to evaluate flow at rest and with distal augmentation  maneuvers in the common femoral, femoral and popliteal veins. COMPARISON:  None Available. FINDINGS: RIGHT LOWER EXTREMITY Common Femoral Vein: No evidence of thrombus. Normal compressibility, respiratory phasicity and response to augmentation. Saphenofemoral Junction: No evidence of thrombus. Normal compressibility and flow on color Doppler imaging. Profunda Femoral Vein: No evidence of thrombus. Normal compressibility and flow on color Doppler imaging. Femoral Vein: No evidence of thrombus. Normal compressibility, respiratory phasicity and response to augmentation. Popliteal Vein: No evidence of thrombus. Normal compressibility, respiratory phasicity and response to augmentation. Calf Veins: No evidence of thrombus. Normal compressibility and flow on color Doppler imaging. Superficial Great Saphenous Vein: No evidence of thrombus. Normal compressibility. Venous Reflux:  None. Other Findings:  None. LEFT LOWER EXTREMITY Common Femoral Vein: No evidence of thrombus. Normal compressibility, respiratory phasicity and response to augmentation. Saphenofemoral Junction: No evidence of thrombus. Normal compressibility and flow on color Doppler imaging. Profunda Femoral Vein: No evidence of thrombus. Normal compressibility and flow on color Doppler imaging. Femoral Vein: No evidence of thrombus. Normal compressibility, respiratory phasicity and response to augmentation. Popliteal Vein: No evidence of thrombus. Normal compressibility, respiratory phasicity and response to augmentation. Calf Veins: No evidence of thrombus. Normal compressibility and flow on color Doppler imaging. Superficial Great Saphenous Vein: No evidence of thrombus. Normal compressibility. Venous Reflux:  None. Other Findings:  None. IMPRESSION: No evidence of deep venous thrombosis in either lower extremity. Electronically Signed   By: Franchot Gallo M.D.   On: 02/05/2022 18:11   DG Chest Port 1 View  Result Date: 02/05/2022 CLINICAL DATA:  Altered  level of consciousness EXAM: PORTABLE CHEST 1 VIEW COMPARISON:  02/19/2021 FINDINGS: 2 frontal views of the chest demonstrate an enlarged cardiac silhouette. Stable ectasia and atherosclerosis of the thoracic aorta. Mild increased central vascular congestion, with patchy bibasilar airspace disease. Trace right pleural effusion. No pneumothorax. No acute bony abnormalities. IMPRESSION: 1. Findings consistent with mild congestive heart failure. Electronically Signed   By: Randa Ngo M.D.   On: 02/05/2022 18:13   ECHOCARDIOGRAM COMPLETE  Result Date: 02/06/2022    ECHOCARDIOGRAM REPORT   Patient Name:   Austin Fleming Date of Exam: 02/06/2022 Medical Rec #:  [id number]     Height:       67.0 in Accession #:    [id number]    Weight:       142.4 lb Date of Birth:  08-18-1926     BSA:          1.750 m Patient Age:    77 years      BP:           135/111 mmHg Patient Gender: M  HR:           95 bpm. Exam Location:  [facility] Procedure: 2D Echo, Color Doppler and Cardiac Doppler Indications:     Elevated troponin  History:         Patient has prior history of Echocardiogram examinations.                  Stroke; Arrythmias:AV block. Altered mental status.  Sonographer:     Charmayne Sheer Referring Phys:  [id number] [place] Diagnosing Phys: [place]  1. Left ventricular ejection fraction, by estimation, is 30 to 35%. The left ventricle has severely decreased function. The left ventricle demonstrates global hypokinesis. The left ventricular internal cavity size was severely dilated. There is mild concentric left ventricular hypertrophy. Left ventricular diastolic parameters are consistent with Grade III diastolic dysfunction (restrictive).  2. Right ventricular systolic function is moderately reduced. The right ventricular size is severely enlarged.  3. Left atrial size was severely dilated.  4. Right atrial size was severely dilated.  5. The mitral valve is normal in structure. Mild mitral valve  regurgitation. No evidence of mitral stenosis.  6. Tricuspid valve regurgitation is moderate.  7. The aortic valve is normal in structure. Aortic valve regurgitation is mild. No aortic stenosis is present.  8. The inferior vena cava is normal in size with greater than 50% respiratory variability, suggesting right atrial pressure of 3 mmHg. FINDINGS  Left Ventricle: Left ventricular ejection fraction, by estimation, is 30 to 35%. The left ventricle has severely decreased function. The left ventricle demonstrates global hypokinesis. The left ventricular internal cavity size was severely dilated. There is mild concentric left ventricular hypertrophy. Left ventricular diastolic parameters are consistent with Grade III diastolic dysfunction (restrictive). Right Ventricle: The right ventricular size is severely enlarged. No increase in right ventricular wall thickness. Right ventricular systolic function is moderately reduced. Left Atrium: Left atrial size was severely dilated. Right Atrium: Right atrial size was severely dilated. Pericardium: There is no evidence of pericardial effusion. Mitral Valve: The mitral valve is normal in structure. Mild mitral valve regurgitation. No evidence of mitral valve stenosis. MV peak gradient, 6.8 mmHg. The mean mitral valve gradient is 3.0 mmHg. Tricuspid Valve: The tricuspid valve is normal in structure. Tricuspid valve regurgitation is moderate . No evidence of tricuspid stenosis. Aortic Valve: The aortic valve is normal in structure. Aortic valve regurgitation is mild. No aortic stenosis is present. Aortic valve mean gradient measures 3.0 mmHg. Aortic valve peak gradient measures 5.7 mmHg. Aortic valve area, by VTI measures 1.24 cm. Pulmonic Valve: The pulmonic valve was normal in structure. Pulmonic valve regurgitation is mild. No evidence of pulmonic stenosis. Aorta: The aortic root is normal in size and structure. Venous: The inferior vena cava is normal in size with greater  than 50% respiratory variability, suggesting right atrial pressure of 3 mmHg. IAS/Shunts: No atrial level shunt detected by color flow Doppler.  LEFT VENTRICLE PLAX 2D LVIDd:         5.14 cm   Diastology LVIDs:         4.30 cm   LV e' medial:    6.85 cm/s LV PW:         1.02 cm   LV E/e' medial:  15.7 LV IVS:        0.68 cm   LV e' lateral:   9.14 cm/s LVOT diam:     1.80 cm   LV E/e' lateral: 11.8 LV SV:  24 LV SV Index:   14 LVOT Area:     2.54 cm  RIGHT VENTRICLE RV Basal diam:  3.87 cm LEFT ATRIUM             Index        RIGHT ATRIUM           Index LA diam:        3.60 cm 2.06 cm/m   RA Area:     20.50 cm LA Vol (A2C):   56.3 ml 32.16 ml/m  RA Volume:   66.20 ml  37.82 ml/m LA Vol (A4C):   40.7 ml 23.25 ml/m LA Biplane Vol: 50.9 ml 29.08 ml/m  AORTIC VALVE                    PULMONIC VALVE AV Area (Vmax):    1.31 cm     PV Vmax:          0.75 m/s AV Area (Vmean):   1.11 cm     PV Vmean:         51.100 cm/s AV Area (VTI):     1.24 cm     PV VTI:           0.112 m AV Vmax:           119.00 cm/s  PV Peak grad:     2.2 mmHg AV Vmean:          88.100 cm/s  PV Mean grad:     1.0 mmHg AV VTI:            0.195 m      PR End Diast Vel: 21.53 msec AV Peak Grad:      5.7 mmHg AV Mean Grad:      3.0 mmHg LVOT Vmax:         61.40 cm/s LVOT Vmean:        38.600 cm/s LVOT VTI:          0.095 m LVOT/AV VTI ratio: 0.49  AORTA Ao Root diam: 3.90 cm MITRAL VALVE                TRICUSPID VALVE MV Area (PHT): 6.04 cm     TR Peak grad:   76.0 mmHg MV Area VTI:   1.89 cm     TR Vmax:        436.00 cm/s MV Peak grad:  6.8 mmHg MV Mean grad:  3.0 mmHg     SHUNTS MV Vmax:       1.30 m/s     Systemic VTI:  0.10 m MV Vmean:      85.2 cm/s    Systemic Diam: 1.80 cm MV Decel Time: 126 msec MV E velocity: 107.50 cm/s [employer] Electronically signed by Neoma Laming Signature Date/Time: 02/06/2022/12:07:07 PM    Final      TELEMETRY reviewed by me: Sinus rhythm with second-degree AV block, rate high 70s to low 100s  overnight without evidence of long sinus pauses or sick sinus syndrome   ASSESSMENT AND PLAN:  Austin Fleming is a 77yoM with a PMH of dementia on aricept, h/o CVA suspicious for cerebral amyloid angiopathy, h/o intracranial bleed (taken off antiplts), 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  Cardiology is consulted because of his EKG  showing a 2nd degree AV block.   #Second-degree AV block (Wenkebach) Present on EKG on admission, but appears similar to EKG from June 2022.  Telemetry is without long sinus pauses or evidence of sick sinus syndrome.  Heart rate has been mostly in the high 70s to low 100s overnight. -Okay to hold beta-blocker, other AV blocking agents -No indication for permanent pacemaker at this time -Continuous monitoring on telemetry  #New onset HFrEF (LVEF 30-35%, global hypo-, G3 DD) LVEF from 02/2021 was 60-65% with grade 1 diastolic dysfunction, unclear etiology of his reduced EF, per chart review has not been seen by cardiology formally in outpatient setting, and with his history of bleeding, ongoing thrombocytopenia, advanced age, and current altered mental status he is not a good candidate for invasive cardiac evaluation.  He appears clinically volume overloaded with 2+ pitting edema bilaterally on exam and BNP markedly elevated to 4400. -Agree with Lasix 40 mg IV twice daily -Hold beta-blockers as above, defer ACE/ARB due to AKI -Consider addition of Isordil/hydralazine for further afterload reduction if his blood pressure tolerates -Repeat BMP daily -Strict I's/O -Palliative care input may be helpful to establish goals of care   #Elevated troponin Reportedly denies chest pain on admission.  High-sensitivity troponin elevated but flat trending [phone].  This is possibly demand  ischemia in the setting of volume overload as above and conservative management from a cardiac standpoint -Defer antiplatelets and heparin drip due to history of intracranial bleed  #AKI on CKD 3 Currently creatinine 2.56, EGFR 23 Baseline over the past 6 months his creatinine between 2.0-2.2, GFR between 34-38.  This patient's plan of care was discussed and created with Dr. Saralyn Pilar and he is in agreement.  Signed: Tristan Schroeder , PA-C 02/07/2022, 7:53 AM [facility]

## 2022-02-07 NOTE — Evaluation (Signed)
Occupational Therapy Evaluation Patient Details Name: Austin Fleming MRN: [id number] DOB: 1925-12-28 Today's Date: 02/07/2022   History of Present Illness Pt is a 88 yo male that presented for increased weakness, AMS. Workup showed possible heart block, with no acute intervention per cardiology, acute on chronic CHF. PMH of CKD, CVA, CAA, dementia, intracranial bleed.   Clinical Impression   Patient presenting with decreased Ind in self care, balance, functional mobility/transfers, endurance, and safety awareness. Patient's family present to confirm baseline and reports he lives in his own home but family members have been rotating to provide assist/supervision throughout the day. Pt was able to ambulate with RW and family did assist with self care and IADL tasks as needed. Patient currently functioning at mod - max A with RW and fatigues quickly.  Patient will benefit from acute OT to increase overall independence in the areas of ADLs, functional mobility, and safety awareness in order to safely discharge to next venue of care.      Recommendations for follow up therapy are one component of a multi-disciplinary discharge planning process, led by the attending physician.  Recommendations may be updated based on patient status, additional functional criteria and insurance authorization.   Follow Up Recommendations  Skilled nursing-short term rehab (<3 hours/day)    Assistance Recommended at Discharge Frequent or constant Supervision/Assistance  Patient can return home with the following A lot of help with walking and/or transfers;A lot of help with bathing/dressing/bathroom;Assistance with cooking/housework;Direct supervision/assist for medications management;Direct supervision/assist for financial management;Assist for transportation    Functional Status Assessment  Patient has had a recent decline in their functional status and demonstrates the ability to make significant improvements in  function in a reasonable and predictable amount of time.  Equipment Recommendations  Other (comment) (defer to next venue of care)       Precautions / Restrictions Precautions Precautions: Fall Restrictions Weight Bearing Restrictions: No      Mobility Bed Mobility Overal bed mobility: Needs Assistance Bed Mobility: Sit to Supine       Sit to supine: Max assist        Transfers Overall transfer level: Needs assistance Equipment used: Rolling walker (2 wheels) Transfers: Sit to/from Stand, Bed to chair/wheelchair/BSC Sit to Stand: Mod assist     Step pivot transfers: Max assist            Balance Overall balance assessment: Needs assistance Sitting-balance support: Feet supported, Bilateral upper extremity supported Sitting balance-Leahy Scale: Fair     Standing balance support: During functional activity, Reliant on assistive device for balance Standing balance-Leahy Scale: Poor                             ADL either performed or assessed with clinical judgement   ADL Overall ADL's : Needs assistance/impaired Eating/Feeding: Set up;Sitting                   Lower Body Dressing: Maximal assistance                       Vision Patient Visual Report: No change from baseline              Pertinent Vitals/Pain Pain Assessment Pain Assessment: Faces Faces Pain Scale: No hurt     Hand Dominance Right   Extremity/Trunk Assessment Upper Extremity Assessment Upper Extremity Assessment: Generalized weakness   Lower Extremity Assessment Lower Extremity Assessment: Generalized weakness  Communication Communication Communication: HOH   Cognition Arousal/Alertness: Awake/alert Behavior During Therapy: Flat affect Overall Cognitive Status: History of cognitive impairments - at baseline                                 General Comments: pt oriented to self and birthday only. does follow all simple  commands, sometimes with repetition                Home Living Family/patient expects to be discharged to:: Private residence Living Arrangements: Children Available Help at Discharge: Family;Available PRN/intermittently Type of Home: House Home Access: Stairs to enter [employer] of Steps: 4   Home Layout: One level         Biochemist, clinical: Standard     Home Equipment: Conservation officer, nature (2 wheels)          Prior Functioning/Environment               Mobility Comments: pt poor historian, but family reported he has been ambulator with RW, but has been experiencing a decline for a couple weeks now ADLs Comments: Family has been assisting pt with self care. Sounds like they have almost put together 24/7 care for him at home with multiple family members involved.        OT Problem List: Decreased strength;Decreased coordination;Decreased cognition;Decreased activity tolerance;Decreased safety awareness;Impaired balance (sitting and/or standing);Decreased knowledge of precautions      OT Treatment/Interventions: Self-care/ADL training;Balance training;Therapeutic exercise;Therapeutic activities;DME and/or AE instruction;Manual therapy;Patient/family education    OT Goals(Current goals can be found in the care plan section) Acute Rehab OT Goals Patient Stated Goal: to go to rehab OT Goal Formulation: With patient/family Time For Goal Achievement: 02/21/22 Potential to Achieve Goals: Good ADL Goals Pt Will Perform Grooming: with min assist;standing Pt Will Perform Lower Body Dressing: with min assist;sit to/from stand Pt Will Transfer to Toilet: with min assist;ambulating Pt Will Perform Toileting - Clothing Manipulation and hygiene: with min assist  OT Frequency: Min 2X/week       AM-PAC OT "6 Clicks" Daily Activity     Outcome Measure Help from another person eating meals?: A Little Help from another person taking care of personal grooming?: A  Little Help from another person toileting, which includes using toliet, bedpan, or urinal?: A Lot Help from another person bathing (including washing, rinsing, drying)?: A Lot Help from another person to put on and taking off regular upper body clothing?: A Little Help from another person to put on and taking off regular lower body clothing?: A Lot 6 Click Score: 15   End of Session Equipment Utilized During Treatment: Rolling walker (2 wheels) Nurse Communication: Mobility status  Activity Tolerance: Patient limited by fatigue Patient left: in bed;with bed alarm set;with call bell/phone within reach;with family/visitor present  OT Visit Diagnosis: Unsteadiness on feet (R26.81);Repeated falls (R29.6);Muscle weakness (generalized) (M62.81)                Time: 2595-6387 OT Time Calculation (min): 18 min Charges:  OT General Charges $OT Visit: 1 Visit OT Evaluation $OT Eval Moderate Complexity: 1 Mod OT Treatments $Therapeutic Activity: 8-22 mins  Darleen Crocker, MS, OTR/L , CBIS ascom [phone]  02/07/22, 4:37 PM

## 2022-02-07 NOTE — NC FL2 (Signed)
[place] LEVEL OF CARE SCREENING TOOL     IDENTIFICATION  Patient Name: Austin Fleming Birthdate: Mar 21, 1926 Sex: male Admission Date (Current Location): 02/05/2022  [facility] and [place] Number:  Engineering geologist and Address:  [facility], [address] [place], [place] [address]      Provider Number: [id number]  Attending Physician Name and Address:  Sharen Hones, MD  Relative Name and Phone Number:       Current Level of Care: Hospital Recommended Level of Care: [place] Prior Approval Number:    Date Approved/Denied:   PASRR Number: [id number]  Discharge Plan: SNF    Current Diagnoses: Patient Active Problem List   Diagnosis Date Noted   Acute renal failure superimposed on stage 3b chronic kidney disease ([place]) 02/06/2022   Vascular dementia ([place]) 02/06/2022   History of stroke 02/06/2022   Acute on chronic systolic CHF (congestive heart failure) ([place]) 93/81/8299   Metabolic acidosis 37/16/9678   Thrombocytopenia ([place]) 02/06/2022   B12 deficiency anemia 02/06/2022   Generalized weakness 02/05/2022   Normochromic normocytic anemia 03/07/2021   Hyperglycemia 02/26/2021   Mobitz type 1 second degree AV block 02/26/2021   AMS (altered mental status) 02/19/2021   Syncopal episodes 02/19/2021   Dehydration 02/19/2021   CKD (chronic kidney disease), stage IV ([place]) 02/19/2021   Acute CVA (cerebrovascular accident) ([place]) 02/19/2021    Orientation RESPIRATION BLADDER Height & Weight     Self  O2 (Nasal cannula 2 L) Incontinent Weight: 145 lb 15.1 oz (66.2 kg) Height:  5\' 7"  (170.2 cm)  BEHAVIORAL SYMPTOMS/MOOD NEUROLOGICAL BOWEL NUTRITION STATUS   (None)  (Vascular dementia, History of CVA.) Continent Diet (DYS 3. Finely chopped meats and solids.)  AMBULATORY STATUS COMMUNICATION OF NEEDS Skin   Extensive Assist Verbally Other (Comment) (Deep tissue injury on bilateral heels: Foam.)                        Personal Care Assistance Level of Assistance  Bathing, Feeding, Dressing Bathing Assistance: Maximum assistance Feeding assistance: Limited assistance Dressing Assistance: Maximum assistance     Functional Limitations Info  Sight, Hearing, Speech Sight Info: Adequate Hearing Info: Adequate Speech Info: Adequate    SPECIAL CARE FACTORS FREQUENCY  PT (By licensed PT), OT (By licensed OT)     PT Frequency: 5 x week OT Frequency: 5 x week            Contractures Contractures Info: Not present    Additional Factors Info  Code Status, Allergies Code Status Info: DNR Allergies Info: NKDA           Current Medications (02/07/2022):  This is the current hospital active medication list Current Facility-Administered Medications  Medication Dose Route Frequency Provider Last Rate Last Admin   atorvastatin (LIPITOR) tablet 40 mg  40 mg Oral Daily Irene Pap N, DO   40 mg at 02/07/22 9381   Chlorhexidine Gluconate Cloth 2 % PADS 6 each  6 each Topical Q0600 Kayleen Memos, DO   6 each at 02/07/22 0556   donepezil (ARICEPT) tablet 5 mg  5 mg Oral QHS Sharen Hones, MD   5 mg at 02/06/22 2212   furosemide (LASIX) 200 mg in dextrose 5 % 100 mL (2 mg/mL) infusion  6 mg/hr Intravenous Continuous Sharen Hones, MD       MEDLINE mouth rinse  15 mL Mouth Rinse BID Sharen Hones, MD  mirtazapine (REMERON) tablet 7.5 mg  7.5 mg Oral QHS Sharen Hones, MD   7.5 mg at 02/06/22 2212   sodium bicarbonate tablet 1,300 mg  1,300 mg Oral TID Sharen Hones, MD       vitamin B-12 (CYANOCOBALAMIN) tablet 1,000 mcg  1,000 mcg Oral Daily Sharen Hones, MD   1,000 mcg at 02/07/22 2119     Discharge Medications: Please see discharge summary for a list of discharge medications.  Relevant Imaging Results:  Relevant Lab Results:   Additional Information SS#: [id number]. Daughter is legal guardian.  Candie Chroman, LCSW

## 2022-02-07 NOTE — Evaluation (Addendum)
Clinical/Bedside Swallow Evaluation Patient Details  Name: Austin Fleming MRN: [id number] Date of Birth: [birth year]  Today's Date: 02/07/2022 Time: SLP Start Time (ACUTE ONLY): 1540 SLP Stop Time (ACUTE ONLY): 1410 SLP Time Calculation (min) (ACUTE ONLY): 15 min  Past Medical History: History reviewed. No pertinent past medical history. Past Surgical History: History reviewed. No pertinent surgical history. HPI:  Per H&P "Austin Fleming is a 88 y.o. male with medical history significant for dementia on Aricept, CVA, reported history of intracranial bleed this year, taken off antiplatelets, Mobitz type I second-degree AV block, HFpEF 55 to 08%, grade 1 diastolic dysfunction, ambulatory dysfunction uses a walker at baseline, incontinent to stool and urine, CKD 3B who presented to [facility] ED with complaints of generalized weakness associated with altered mental status for the last 2 days acutely worsening today to the point where he was unable to move around as he normally does.  More confused.  Denies having any pain.  No chest pain.  No cough.  No dyspnea.  No reported subjective fevers or chills.  The patient was brought into the ED for further evaluation.  Upon assessment in the ED, work-up reveals elevated troponin 544, Mobitz type II on twelve-lead EKG, elevated creatinine 2.6 from baseline of 1.8, lactic acid 2.3.  EDP consulted cardiology who recommended trending high-sensitivity troponin and holding off anticoagulation at this time due to recent intracranial bleed.  TRH, hospitalist team, was asked to admit."    Assessment / Plan / Recommendation  Clinical Impression  Pt alert, slow to respond. Daughter and son-in-law at bedside. Daughter denies pt with observed swallowing difficulty during this admission or PTA. Pt on 2L/min O2 via [place]. Hypophonic vocal quality appreciated as well as generally deconditioned state.   Per chart review, pt had MBSS, 06/01/21, with recommendation for a mech soft diet with  thin liquids. See note for details. Per chart review, temp and WBC WNL. CXR, 02/05/22, "1. Findings consistent with mild congestive heart failure."  Pt observed with trials of soft solid (banana) and thin liquids (via cup and straw). Pt with s/sx mild oral dysphagia c/b mildly prolonged mastication of soft solid. Pt's daughter endorses this is baseline for pt and is secondary to pt's dental status. Pharyngeal swallow appeared [facility] per clinical assessment. No overt s/sx pharyngeal dysphagia. No change to vocal quality. Seemingly adequate laryngeal elevation and timely swallow initiation. Pt able to feed self with set up assistance.  Recommend continuation of mech soft diet with thin liquids - solids finely chopped - and safe swallowing strategies/aspiration precautions as outlined below.   Pt is at mildly increased risk for aspiration/aspiration PNA given AMS/cognitive decline, medical comorbities, and dental status. Recommend set-up and supervision with POs.  Pt with poor PO intake per daughter report and EMR. Pt may benefit from RD consult.   SLP to s/o as pt has no acute SLP needs and is likely at swallowing baseline.   Pt, pt's daughter daughter and RN made aware of results, recommendations, and SLP POC. ?understanding by pt. Daughter verbalized understanding/agreement.   SLP Visit Diagnosis: Dysphagia, oral phase (R13.11)    Aspiration Risk  Mild aspiration risk    Diet Recommendation Dysphagia 3 (Mech soft);Thin liquid   Medication Administration:  (as tolerated) Supervision: Patient able to self feed;Full supervision/cueing for compensatory strategies Compensations: Minimize environmental distractions;Slow rate;Small sips/bites Postural Changes: Seated upright at 90 degrees;Remain upright for at least 30 minutes after po intake    Other  Recommendations Recommended Consults:  (Registered  Dietician Consult) Oral Care Recommendations: Oral care QID;Staff/trained caregiver to provide oral  care    Recommendations for follow up therapy are one component of a multi-disciplinary discharge planning process, led by the attending physician.  Recommendations may be updated based on patient status, additional functional criteria and insurance authorization.  Follow up Recommendations No SLP follow up      Assistance Recommended at Discharge Frequent or constant Supervision/Assistance  Functional Status Assessment Patient has not had a recent decline in their functional status      Prognosis Prognosis for Safe Diet Advancement: Fair Barriers to Reach Goals: Cognitive deficits      Swallow Study   General Date of Onset: 02/05/22 HPI: Per H&P "Austin Fleming is a 88 y.o. male with medical history significant for dementia on Aricept, CVA, reported history of intracranial bleed this year, taken off antiplatelets, Mobitz type I second-degree AV block, HFpEF 55 to 02%, grade 1 diastolic dysfunction, ambulatory dysfunction uses a walker at baseline, incontinent to stool and urine, CKD 3B who presented to [facility] ED with complaints of generalized weakness associated with altered mental status for the last 2 days acutely worsening today to the point where he was unable to move around as he normally does.  More confused.  Denies having any pain.  No chest pain.  No cough.  No dyspnea.  No reported subjective fevers or chills.  The patient was brought into the ED for further evaluation.  Upon assessment in the ED, work-up reveals elevated troponin 544, Mobitz type II on twelve-lead EKG, elevated creatinine 2.6 from baseline of 1.8, lactic acid 2.3.  EDP consulted cardiology who recommended trending high-sensitivity troponin and holding off anticoagulation at this time due to recent intracranial bleed.  TRH, hospitalist team, was asked to admit." Type of Study: Bedside Swallow Evaluation Previous Swallow Assessment: MBSS 06/01/32 mech soft diet with thin liquids Diet Prior to this Study: Dysphagia 3  (soft);Thin liquids Temperature Spikes Noted: No Respiratory Status: Nasal cannula History of Recent Intubation: No Behavior/Cognition: Alert Oral Cavity Assessment: Within Functional Limits Oral Care Completed by SLP: Recent completion by staff Oral Cavity - Dentition: Missing dentition Vision: Functional for self-feeding Self-Feeding Abilities: Able to feed self;Needs set up Patient Positioning: Upright in bed Baseline Vocal Quality: Low vocal intensity Volitional Cough:  (DNT) Volitional Swallow:  (DNT)    Oral/Motor/Sensory Function Overall Oral Motor/Sensory Function: Generalized oral weakness   Ice Chips Ice chips: Not tested   Thin Liquid Thin Liquid: Within functional limits Presentation: Cup;Straw Other Comments: ~4 oz    Solid     Solid: Impaired Presentation: Self Fed Oral Phase Impairments: Impaired mastication Oral Phase Functional Implications: Impaired mastication Pharyngeal Phase Impairments:  (appeared WFL) Other Comments: mildly prolonged, but functional mastication, with banana     Cherrie Gauze, M.S., [facility] [phone] (ASCOM)  Clearnce Sorrel Lissandra Keil 02/07/2022,2:50 PM

## 2022-02-07 NOTE — Progress Notes (Signed)
Patient altered and confused. Not following commands or able to tolerate PO meds and fluids at this time. 2200 PO meds not given and documented in [facility]. Will monitor. Provider notified.

## 2022-02-08 DIAGNOSIS — Z7189 Other specified counseling: Secondary | ICD-10-CM | POA: Diagnosis not present

## 2022-02-08 DIAGNOSIS — T68XXXA Hypothermia, initial encounter: Secondary | ICD-10-CM | POA: Diagnosis not present

## 2022-02-08 DIAGNOSIS — I5023 Acute on chronic systolic (congestive) heart failure: Secondary | ICD-10-CM | POA: Diagnosis not present

## 2022-02-08 DIAGNOSIS — N1832 Chronic kidney disease, stage 3b: Secondary | ICD-10-CM | POA: Diagnosis not present

## 2022-02-08 DIAGNOSIS — F01C0 Vascular dementia, severe, without behavioral disturbance, psychotic disturbance, mood disturbance, and anxiety: Secondary | ICD-10-CM | POA: Diagnosis not present

## 2022-02-08 DIAGNOSIS — N170 Acute kidney failure with tubular necrosis: Secondary | ICD-10-CM | POA: Diagnosis not present

## 2022-02-08 LAB — BASIC METABOLIC PANEL
Anion gap: 7 (ref 5–15)
BUN: 55 mg/dL — ABNORMAL HIGH (ref 8–23)
CO2: 23 mmol/L (ref 22–32)
Calcium: 8.9 mg/dL (ref 8.9–10.3)
Chloride: 113 mmol/L — ABNORMAL HIGH (ref 98–111)
Creatinine, Ser: 2.69 mg/dL — ABNORMAL HIGH (ref 0.61–1.24)
GFR, Estimated: 21 mL/min — ABNORMAL LOW (ref >60)
Glucose, Bld: 80 mg/dL (ref 70–99)
Potassium: 3.9 mmol/L (ref 3.5–5.1)
Sodium: 143 mmol/L (ref 135–145)

## 2022-02-08 LAB — MAGNESIUM: Magnesium: 2.5 mg/dL — ABNORMAL HIGH (ref 1.7–2.4)

## 2022-02-08 MED ORDER — LACTULOSE 10 GM/15ML PO SOLN
20.0000 g | Freq: Once | ORAL | Status: AC
  Administered 2022-02-08: 20 g via ORAL
  Filled 2022-02-08: qty 30

## 2022-02-08 MED ORDER — SODIUM BICARBONATE 650 MG PO TABS
650.0000 mg | ORAL_TABLET | Freq: Three times a day (TID) | ORAL | Status: DC
  Administered 2022-02-08 – 2022-02-10 (×6): 650 mg via ORAL
  Filled 2022-02-08 (×6): qty 1

## 2022-02-08 NOTE — TOC Progression Note (Signed)
Transition of Care [facility]) - Progression Note    Patient Details  Name: MONNIE ANSPACH MRN: [id number] Date of Birth: [birth year]  Transition of Care [facility]) CM/SW [place], LCSW Phone Number: 02/08/2022, 2:36 PM  Clinical Narrative:  Daughter not in room. Called her and she said I could leave SNF bed offer list with her husband and another gentleman in the room. Will follow up with preference tomorrow.   Expected Discharge Plan: [place] Barriers to Discharge: Continued Medical Work up  Expected Discharge Plan and Services Expected Discharge Plan: [place] Acute Care Choice:  (TBD) Living arrangements for the past 2 months: Single Family Home                                       Social Determinants of Health (SDOH) Interventions    Readmission Risk Interventions     View : No data to display.

## 2022-02-08 NOTE — Progress Notes (Signed)
Mobility Specialist - Progress Note   02/08/22 1500  Mobility  Activity Turned to right side;Turned to left side  Level of Assistance Moderate assist, patient does 50-74%  Distance Ambulated (ft) 0 ft  Activity Response Tolerated well  $Mobility charge 1 Mobility     Pt lying in bed upon arrival, utilizing RA. Pt initially agreeable to session. After room set-up, pt was able to swing LE off of bed with assistance to prepare for EOB sitting. However, pt then began to decline further activity. Mobility + family at bedside provided heavy encouragement but pt continued to decline and asked to return next date. Pt returned to semi-supine and was readjusted for comfort. Pt left in bed with alarm set, needs in reach.    Kathee Delton Mobility Specialist 02/08/22, 3:35 PM

## 2022-02-08 NOTE — Progress Notes (Signed)
  Progress Note   Patient: Austin Fleming DOB: 18-Aug-1926 DOA: 02/05/2022     3 DOS: the patient was seen and examined on 02/08/2022   Brief hospital course: Austin Fleming is a 88 y.o. male with medical history significant for dementia on Aricept, CVA, reported history of intracranial bleed this year, taken off antiplatelets, Mobitz type I second-degree AV block, HFpEF 55 to 63%, grade 1 diastolic dysfunction, ambulatory dysfunction uses a walker at baseline, incontinent to stool and urine, CKD 3B who presented to [facility] ED with complaints of generalized weakness associated with altered mental status for the last 2 days.  Patient had elevated troponin at 472, BNP at 4451.  Chest x-ray showed vascular congestion consistent with CHF.  Started on Lasix drip.  Assessment and Plan: Acute on chronic systolic congestive heart failure. Elevated troponin secondary to congestive heart failure. Generalized weakness secondary to acute congestive heart failure. Second degree AV block with Wenckebach. Hypothermia. Patient doing better today, he developed some hypothermia after giving Lasix.  Received 25 g albumin.  Temperature is better. Check cortisol level.  Continue Lasix drip.  Monitor electrolytes and renal function.   Chronic vitamin B12 deficient anemia. Mild thrombocytopenia. Continue B12 orally.   Vascular dementia. History of stroke. Followed by [facility] care.   Acute kidney injury on chronic kidney stage IIIb. Metabolic acidosis. Hyperkalemia. Renal functions better after Lasix drip.  Continue sodium bicarbonate but reduce the dose.      Subjective:  Patient slept better last night.  Still has confusion which is baseline. Patient has poor appetite, but per daughter, patient was eating small amount of food.  Physical Exam: Vitals:   02/08/22 0357 02/08/22 0604 02/08/22 0959 02/08/22 1323  BP: (!) 121/92  114/87 (!) 120/92  Pulse: 89  88 92  Resp: 19  16 18   Temp: 98.8  F (37.1 C)  99.3 F (37.4 C) 97.7 F (36.5 C)  TempSrc: Rectal     SpO2: 100% 100% 100% 100%  Weight: 62.4 kg     Height:       General exam: Appears calm and comfortable  Respiratory system: Decreased breathing sounds. Respiratory effort normal. Cardiovascular system: S1 & S2 heard, RRR. No JVD, murmurs, rubs, gallops or clicks. No pedal edema. Gastrointestinal system: Abdomen is nondistended, soft and nontender. No organomegaly or masses felt. Normal bowel sounds heard. Central nervous system: Alert and oriented x1. No focal neurological deficits. Extremities: Symmetric 5 x 5 power. Skin: No rashes, lesions or ulcers   Data Reviewed:  Results reviewed.  Family Communication: Daughter updated at bedside.  Disposition: Status is: Inpatient Remains inpatient appropriate because: Severity of disease, IV Lasix drip.  Planned Discharge Destination: Skilled nursing facility    Time spent: 35 minutes  Author: Sharen Hones, MD 02/08/2022 1:33 PM  For on call review [email or web address].

## 2022-02-08 NOTE — Progress Notes (Signed)
Daily Progress Note   Patient Name: Austin Fleming       Date: 02/08/2022 DOB: 07-06-1926  Age: 88 y.o. MRN#: [id number] Attending Physician: Sharen Hones, MD Primary Care Physician: Leonel Ramsay, MD Admit Date: 02/05/2022  Reason for Consultation/Follow-up: Establishing goals of care  Subjective: Patient more alert today but unable to participate in conversation, denies pain  Length of Stay: 3  Current Medications: Scheduled Meds:   atorvastatin  40 mg Oral Daily   Chlorhexidine Gluconate Cloth  6 each Topical Q0600   donepezil  5 mg Oral QHS   mouth rinse  15 mL Mouth Rinse BID   melatonin  5 mg Oral QHS   mirtazapine  7.5 mg Oral QHS   QUEtiapine  25 mg Oral QHS   sodium bicarbonate  1,300 mg Oral TID   cyanocobalamin  1,000 mcg Oral Daily    Continuous Infusions:  furosemide (LASIX) 200 mg in dextrose 5% 100 mL (2mg /mL) infusion 6 mg/hr (02/08/22 0518)    PRN Meds:   Physical Exam Constitutional:      General: He is not in acute distress.    Appearance: He is ill-appearing.  Pulmonary:     Effort: Pulmonary effort is normal.  Skin:    General: Skin is warm and dry.  Neurological:     Mental Status: He is alert.            Vital Signs: BP 114/87 (BP Location: Left Arm)   Pulse 88   Temp 99.3 F (37.4 C)   Resp 16   Ht 5\' 7"  (1.702 m)   Wt 62.4 kg   SpO2 100%   BMI 21.55 kg/m  SpO2: SpO2: 100 % O2 Device: O2 Device: Nasal Cannula O2 Flow Rate: O2 Flow Rate (L/min): 2 L/min  Intake/output summary:  Intake/Output Summary (Last 24 hours) at 02/08/2022 1113 Last data filed at 02/08/2022 0600 Gross per 24 hour  Intake 236.48 ml  Output 2775 ml  Net -2538.52 ml   LBM: Last BM Date : 02/08/22 Baseline Weight: Weight: 72.6 kg Most recent weight: Weight: 62.4 kg        Palliative Assessment/Data: PPS 20%    Flowsheet Rows    Flowsheet Row Most Recent Value  Intake Tab   Referral Department Hospitalist  Unit at Time of Referral Cardiac/Telemetry Unit  Palliative Care Primary Diagnosis Cardiac  Date Notified 02/07/22  Palliative Care Type New Palliative care  Reason for referral Clarify Goals of Care  Date of Admission 02/05/22  Date first seen by Palliative Care 02/07/22  # of days Palliative referral response time 0 Day(s)  # of days IP prior to Palliative referral 2  Clinical Assessment   Psychosocial & Spiritual Assessment   Palliative Care Outcomes        Patient Active Problem List   Diagnosis Date Noted   Acute renal failure superimposed on stage 3b chronic kidney disease ([place]) 02/06/2022   Vascular dementia ([place]) 02/06/2022   History of stroke 02/06/2022   Acute on chronic systolic CHF (congestive heart failure) ([place]) 26/94/8546   Metabolic acidosis 27/11/5007   Thrombocytopenia ([place]) 02/06/2022   B12 deficiency anemia 02/06/2022   Generalized weakness 02/05/2022  Normochromic normocytic anemia 03/07/2021   Hyperglycemia 02/26/2021   Mobitz type 1 second degree AV block 02/26/2021   AMS (altered mental status) 02/19/2021   Syncopal episodes 02/19/2021   Dehydration 02/19/2021   CKD (chronic kidney disease), stage IV ([place]) 02/19/2021   Acute CVA (cerebrovascular accident) ([place]) 02/19/2021    Palliative Care Assessment & Plan   HPI: 88 y.o. male  with past medical history of dementia on Aricept, CVA, reported history of intracranial bleed this year, taken off antiplatelets, Mobitz type I second-degree AV block, HFpEF 55 to 02%, grade 1 diastolic dysfunction, ambulatory dysfunction uses a walker at baseline, incontinent to stool and urine, and CKD 3B  admitted on 02/05/2022 with generalized weakness and altered mental status.  Found to have acute on chronic systolic heart failure.  Patient continues with significant volume  overload and started on Lasix infusion.  Altered mental status also continues.  PMT consulted to discuss goals of care.  Assessment: Follow-up today with patient and daughter [name] [facility]) at bedside. Patient more alert today and then during my interaction with them yesterday.  Daughter tells me she was told by physician that patient is doing better.  We discussed that his creatinine has slightly improved from 2.8-2.7.  Daughter would like to continue current care and allow time for outcomes.  We reviewed her conversation from yesterday she tells me they have no questions or concerns.  Recommendations/Plan: Continuing to allow time for outcomes Hospice has been introduced but unclear however we will proceed at this time, family would like to see how patient does over the weekend  Code Status: DNR  Care plan was discussed with RN, daughter and Maryln Manuel  Thank you for allowing the Palliative Medicine Team to assist in the care of this patient.   *Please note that this is a verbal dictation therefore any spelling or grammatical errors are due to the "[place] [employer]" system interpretation.  Juel Burrow, DNP, [facility] Palliative Medicine Team Team Phone # [phone]  Pager [phone]

## 2022-02-08 NOTE — Progress Notes (Signed)
Physical Therapy Treatment Patient Details Name: Austin Fleming MRN: [id number] DOB: 10-18-1925 Today's Date: 02/08/2022   History of Present Illness Pt is a 88 yo male that presented for increased weakness, AMS. Workup showed possible heart block, with no acute intervention per cardiology, acute on chronic CHF. PMH of CKD, CVA, CAA, dementia, intracranial bleed.       Recommendations for follow up therapy are one component of a multi-disciplinary discharge planning process, led by the attending physician.  Recommendations may be updated based on patient status, additional functional criteria and insurance authorization.  Follow Up Recommendations  Skilled nursing-short term rehab (<3 hours/day)     Assistance Recommended at Discharge Frequent or constant Supervision/Assistance  Patient can return home with the following Two people to help with walking and/or transfers;Two people to help with bathing/dressing/bathroom;Direct supervision/assist for medications management;Help with stairs or ramp for entrance;Assist for transportation;Assistance with feeding;Direct supervision/assist for financial management;Assistance with cooking/housework   Equipment Recommendations  Other (comment)       Precautions / Restrictions Precautions Precautions: Fall Restrictions Weight Bearing Restrictions: No     Mobility  Bed Mobility Overal bed mobility: Needs Assistance Bed Mobility: Supine to Sit, Sit to Supine     Supine to sit: Min assist, HOB elevated Sit to supine: Mod assist   General bed mobility comments: Pt was able to exit L sid eof bed with increased time and Vcs + min assist. INcreased time to perform. did require increased assistance after OOB activity to progress BLEs into bed.    Transfers Overall transfer level: Needs assistance Equipment used: Rolling walker (2 wheels) Transfers: Sit to/from Stand Sit to Stand: Min assist, Mod assist      General transfer comment: Pt was  able to stand 4 x EOB with min assist. bed height progressively lowed. By the last STS needs mod assist from only minimally elevated surface height    Ambulation/Gait Ambulation/Gait assistance: Min assist, Mod assist Gait Distance (Feet): 3 Feet Assistive device: Rolling walker (2 wheels) Gait Pattern/deviations: Step-to pattern Gait velocity: decrease     General Gait Details: pt able to side step 3 steps from FOB towards HOB.      Balance Overall balance assessment: Needs assistance Sitting-balance support: Feet supported, Bilateral upper extremity supported Sitting balance-Leahy Scale: Fair     Standing balance support: During functional activity, Reliant on assistive device for balance Standing balance-Leahy Scale: Poor        Cognition Arousal/Alertness: Awake/alert Behavior During Therapy: Flat affect Overall Cognitive Status: History of cognitive impairments - at baseline        General Comments: Pt is alert but flat. HOH but cooperative. Easily able to follow commands with increased time           General Comments General comments (skin integrity, edema, etc.): pt performed several exercises seted EOB prior to standing      Pertinent Vitals/Pain Pain Assessment Pain Assessment: No/denies pain Faces Pain Scale: No hurt     PT Goals (current goals can now be found in the care plan section) Acute Rehab PT Goals Patient Stated Goal: None stated Progress towards PT goals: Progressing toward goals    Frequency    Min 2X/week      PT Plan Current plan remains appropriate       AM-PAC PT "6 Clicks" Mobility   Outcome Measure  Help needed turning from your back to your side while in a flat bed without using bedrails?: A Little Help needed moving from  lying on your back to sitting on the side of a flat bed without using bedrails?: A Little Help needed moving to and from a bed to a chair (including a wheelchair)?: A Lot Help needed standing up from  a chair using your arms (e.g., wheelchair or bedside chair)?: A Lot Help needed to walk in hospital room?: A Lot Help needed climbing 3-5 steps with a railing? : Total 6 Click Score: 13    End of Session Equipment Utilized During Treatment: Gait belt Activity Tolerance: Patient limited by fatigue Patient left: in bed;with call bell/phone within reach;with bed alarm set Nurse Communication: Mobility status PT Visit Diagnosis: Other abnormalities of gait and mobility (R26.89);Difficulty in walking, not elsewhere classified (R26.2);Muscle weakness (generalized) (M62.81)     Time: 0034-9179 PT Time Calculation (min) (ACUTE ONLY): 28 min  Charges:  $Therapeutic Exercise: 8-22 mins $Therapeutic Activity: 8-22 mins                    Julaine Fusi PTA 02/08/22, 5:55 PM

## 2022-02-08 NOTE — Hospital Course (Signed)
Austin Fleming is a 88 y.o. male with medical history significant for dementia on Aricept, CVA, reported history of intracranial bleed this year, taken off antiplatelets, Mobitz type I second-degree AV block, HFpEF 55 to 83%, grade 1 diastolic dysfunction, ambulatory dysfunction uses a walker at baseline, incontinent to stool and urine, CKD 3B who presented to [facility] ED with complaints of generalized weakness associated with altered mental status for the last 2 days.  Patient had elevated troponin at 472, BNP at 4451.  Chest x-ray showed vascular congestion consistent with CHF.  Started on Lasix drip.

## 2022-02-08 NOTE — Progress Notes (Signed)
Patient family reported that patient has not passed stool in 4 days. Provider notified. One time dose of lactulose given. Family educated on med education.

## 2022-02-08 NOTE — Progress Notes (Signed)
Initial Nutrition Assessment  DOCUMENTATION CODES:   Underweight, Severe malnutrition in context of chronic illness  INTERVENTION:   -Boost Breeze po TID, each supplement provides 250 kcal and 9 grams of protein  -MVI with minerals daily -Initiate 48 hour calorie count per MD; RD will follow-up on Monday, 02/11/22 for results  NUTRITION DIAGNOSIS:   Severe Malnutrition related to chronic illness (dementia) as evidenced by severe fat depletion, severe muscle depletion.  GOAL:   Patient will meet greater than or equal to 90% of their needs  MONITOR:   PO intake, Supplement acceptance  REASON FOR ASSESSMENT:   Consult, Malnutrition Screening Tool Calorie Count  ASSESSMENT:   Pt with medical history significant for dementia on Aricept, CVA, reported history of intracranial bleed this year, taken off antiplatelets, Mobitz type I second-degree AV block, HFpEF 55 to 65%, grade 1 diastolic dysfunction, CKD 3B who presents with complaints of generalized weakness associated with altered mental status for the last 2 days.  Pt admitted with CHF.   6/1- s/p BSE- advanced to dysphagia 3 diet with thin liquids  Spoke with pt and family members at bedside. Pt daughter reports decreased oral intake over the past 3 weeks. Pt has been eating a liberalized diet of his favorite foods (honey buns, candy bars, etc) in order to entice him to eat. Pt typically eats very well with a regular diet at home.    Observed pt's breakfast tray; he consumed 50% of his pancakes and a spoonful of yogurt (140 kcals, 6 grams protein).   Pt family denies any weight loss, reports pt has always been small framed. Reviewed wt hx; pt has experienced a 6% wt loss over the past year, which is not significant for time frame.   Discussed importance of good meal and supplement intake to promote healing. Family aware that MD requested calorie count. Palliative care has met with family and plan to observe him over the next  few days prior to making decisions. Pt has tried Ensure and ice cream in the past, which he refused, but amenable to try Boost Breeze.   Medications reviewed and include melatonin, vitamin B-12, and lasix.   Labs reviewed: CBGS: 35-152.   NUTRITION - FOCUSED PHYSICAL EXAM:  Flowsheet Row Most Recent Value  Orbital Region Moderate depletion  Upper Arm Region Severe depletion  Thoracic and Lumbar Region Severe depletion  Buccal Region Moderate depletion  Temple Region Severe depletion  Clavicle Bone Region Severe depletion  Clavicle and Acromion Bone Region Severe depletion  Scapular Bone Region Severe depletion  Dorsal Hand Severe depletion  Patellar Region Severe depletion  Anterior Thigh Region Severe depletion  Posterior Calf Region Severe depletion  Edema (RD Assessment) None  Hair Reviewed  Eyes Reviewed  Mouth Reviewed  Skin Reviewed  Nails Reviewed       Diet Order:   Diet Order             DIET DYS 3 Room service appropriate? Yes; Fluid consistency: Thin  Diet effective now                   EDUCATION NEEDS:   Education needs have been addressed  Skin:  Skin Assessment: Skin Integrity Issues: Skin Integrity Issues:: DTI DTI: bilateral heels  Last BM:  02/08/22  Height:   Ht Readings from Last 1 Encounters:  02/05/22 _0  (1.702 m)    Weight:   Wt Readings from Last 1 Encounters:  02/08/22 62.4 kg    Ideal  Body Weight:  67.3 kg  BMI:  Body mass index is 21.55 kg/m.  Estimated Nutritional Needs:   Kcal:  1700-19000  Protein:  85-100 grams  Fluid:  > 1.7 L    Loistine Chance, RD, LDN, [place] Registered Dietitian II Certified Diabetes Care and Education Specialist Please refer to [facility] for RD and/or RD on-call/weekend/after hours pager

## 2022-02-08 NOTE — Progress Notes (Signed)
[place] NOTE       Patient ID: Austin Fleming MRN: [id number] DOB/AGE: 05/24/1926 88 y.o.  Admit date: 02/05/2022 Referring Physician Dr. Roosevelt Locks Primary Physician Dr. Ola Spurr  Primary Cardiologist Dr. 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  Cardiology is consulted because of his EKG showing a 2nd degree AV block.   Interval History:  -Awake and alert today, 4 family members at bedside -Diuresed well on Lasix drip, remains with significant edema lower legs -Denies chest pain, shortness of breath, pain  -Renal function slightly improving  History reviewed. No pertinent past medical history.  History reviewed. No pertinent surgical history.  Medications Prior to Admission  Medication Sig Dispense Refill Last Dose   acetaminophen (TYLENOL) 500 MG tablet Take 500 mg by mouth every 6 (six) hours as needed.   02/04/2022   atorvastatin (LIPITOR) 40 MG tablet Take 40 mg by mouth daily.   02/04/2022   Cholecalciferol 25 MCG (1000 UT) capsule Take 1,000 Units by mouth daily.   Past Month   cyanocobalamin 1000 MCG tablet Take 1,000 mcg by mouth daily.   Past Month   donepezil (ARICEPT) 5 MG tablet Take 5 mg by mouth at bedtime.   02/04/2022   mirtazapine (REMERON) 7.5 MG tablet Take 7.5 mg by mouth at bedtime.   02/04/2022   Misc Natural Products (OSTEO BI-FLEX ADV TRIPLE ST PO) Take 1 tablet by mouth daily.   Past Month   Social History   Socioeconomic History   Marital status: Single    Spouse name: Not on file   Number of children: Not on file   Years  of education: Not on file   Highest education level: Not on file  Occupational History   Occupation: retired  Tobacco Use   Smoking status: Former    Passive exposure: Past   Smokeless tobacco: Former  Scientific laboratory technician Use: Never used  Substance and Sexual Activity   Alcohol use: Never   Drug use: Never   Sexual activity: Not Currently  Other Topics Concern   Not on file  Social History Narrative   Lives with daughter   Social Determinants of Health   Financial Resource Strain: Not on file  Food Insecurity: Not on file  Transportation Needs: Not on file  Physical Activity: Not on file  Stress: Not on file  Social Connections: Not on file  Intimate Partner Violence: Not on file    History reviewed. No pertinent family history.    PHYSICAL EXAM General: Elderly and thin black male, in no acute distress.  Sitting upright in PCU bed with 4 family members at bedside  HEENT:  Normocephalic and atraumatic.  Somewhat hard of hearing Neck:  No JVD.  Lungs: Normal respiratory effort on room air. Clear bilaterally to auscultation. No wheezes, crackles, rhonchi.  Heart: HRRR . Normal S1 and S2 without gallops or murmurs.  Abdomen: Non-distended appearing.  Msk: Moves all extremities spontaneously Extremities: Warm and well perfused. No clubbing, cyanosis.  2+ bilateral lower extremity edema, slightly improving Neuro: Alert, oriented to self Psych: Awake,  answers questions appropriately  Labs:   Lab Results  Component Value Date   WBC 5.1 02/06/2022   HGB 11.4 (L) 02/06/2022   HCT 36.3 (L) 02/06/2022   MCV 89.0 02/06/2022   PLT 95 (L) 02/06/2022    Recent Labs  Lab 02/06/22 0312 02/07/22 0548 02/08/22 0617  NA 140   < > 143  K 5.1   < > 3.9  CL 113*   < > 113*  CO2 18*   < > 23  BUN 48*   < > 55*  CREATININE 2.56*   < > 2.69*  CALCIUM 8.7*   < > 8.9  PROT 6.3*  --   --   BILITOT 1.6*  --   --   ALKPHOS 95  --   --   ALT 41  --   --   AST 31  --   --    GLUCOSE 99   < > 80   < > = values in this interval not displayed.    No results found for: CKTOTAL, CKMB, CKMBINDEX, TROPONINI  Lab Results  Component Value Date   CHOL 145 02/06/2022   CHOL 220 (H) 02/20/2021   Lab Results  Component Value Date   HDL 42 02/06/2022   HDL 51 02/20/2021   Lab Results  Component Value Date   LDLCALC 89 02/06/2022   LDLCALC 159 (H) 02/20/2021   Lab Results  Component Value Date   TRIG 68 02/06/2022   TRIG 51 02/20/2021   Lab Results  Component Value Date   CHOLHDL 3.5 02/06/2022   CHOLHDL 4.3 02/20/2021   No results found for: LDLDIRECT    Radiology: CT Head Wo Contrast  Result Date: 02/05/2022 CLINICAL DATA:  Mental status change, unknown cause EXAM: CT HEAD WITHOUT CONTRAST TECHNIQUE: Contiguous axial images were obtained from the base of the skull through the vertex without intravenous contrast. RADIATION DOSE REDUCTION: This exam was performed according to the departmental dose-optimization program which includes automated exposure control, adjustment of the mA and/or kV according to patient size and/or use of iterative reconstruction technique. COMPARISON:  02/19/2021 FINDINGS: Brain: There is no acute intracranial hemorrhage, mass effect, or edema. Gray-white differentiation is preserved. There is no extra-axial fluid collection. Confluent areas of hypoattenuation in the supratentorial white matter nonspecific but may reflect advanced chronic microvascular ischemic changes. Prominence of the ventricles and sulci reflects parenchymal volume loss. Findings are similar to the prior study. Vascular: There is atherosclerotic calcification at the skull base. Skull: Calvarium is unremarkable. Sinuses/Orbits: Chronically opacified right frontal sinus. Lobular patchy mucosal thickening. Orbits are unremarkable. Other: Mastoid air cells are clear. IMPRESSION: No acute intracranial abnormality. Advanced chronic microvascular ischemic changes.  Electronically Signed   By: Macy Mis M.D.   On: 02/05/2022 14:32   US Venous Img Lower Bilateral (DVT)  Result Date: 02/05/2022 CLINICAL DATA:  Bilateral leg edema EXAM: BILATERAL LOWER EXTREMITY VENOUS DOPPLER ULTRASOUND TECHNIQUE: Gray-scale sonography with graded compression, as well as color Doppler and duplex ultrasound were performed to evaluate the lower extremity deep venous systems from the level of the common femoral vein and including the common femoral, femoral, profunda femoral, popliteal and calf veins including the posterior tibial, peroneal and gastrocnemius veins when visible. The superficial great saphenous vein was also interrogated. Spectral Doppler was utilized to evaluate flow at rest and with distal augmentation maneuvers in the common femoral, femoral and popliteal veins. COMPARISON:  None Available. FINDINGS: RIGHT LOWER EXTREMITY Common Femoral Vein: No evidence  of thrombus. Normal compressibility, respiratory phasicity and response to augmentation. Saphenofemoral Junction: No evidence of thrombus. Normal compressibility and flow on color Doppler imaging. Profunda Femoral Vein: No evidence of thrombus. Normal compressibility and flow on color Doppler imaging. Femoral Vein: No evidence of thrombus. Normal compressibility, respiratory phasicity and response to augmentation. Popliteal Vein: No evidence of thrombus. Normal compressibility, respiratory phasicity and response to augmentation. Calf Veins: No evidence of thrombus. Normal compressibility and flow on color Doppler imaging. Superficial Great Saphenous Vein: No evidence of thrombus. Normal compressibility. Venous Reflux:  None. Other Findings:  None. LEFT LOWER EXTREMITY Common Femoral Vein: No evidence of thrombus. Normal compressibility, respiratory phasicity and response to augmentation. Saphenofemoral Junction: No evidence of thrombus. Normal compressibility and flow on color Doppler imaging. Profunda Femoral Vein: No  evidence of thrombus. Normal compressibility and flow on color Doppler imaging. Femoral Vein: No evidence of thrombus. Normal compressibility, respiratory phasicity and response to augmentation. Popliteal Vein: No evidence of thrombus. Normal compressibility, respiratory phasicity and response to augmentation. Calf Veins: No evidence of thrombus. Normal compressibility and flow on color Doppler imaging. Superficial Great Saphenous Vein: No evidence of thrombus. Normal compressibility. Venous Reflux:  None. Other Findings:  None. IMPRESSION: No evidence of deep venous thrombosis in either lower extremity. Electronically Signed   By: Franchot Gallo M.D.   On: 02/05/2022 18:11   DG Chest Port 1 View  Result Date: 02/05/2022 CLINICAL DATA:  Altered level of consciousness EXAM: PORTABLE CHEST 1 VIEW COMPARISON:  02/19/2021 FINDINGS: 2 frontal views of the chest demonstrate an enlarged cardiac silhouette. Stable ectasia and atherosclerosis of the thoracic aorta. Mild increased central vascular congestion, with patchy bibasilar airspace disease. Trace right pleural effusion. No pneumothorax. No acute bony abnormalities. IMPRESSION: 1. Findings consistent with mild congestive heart failure. Electronically Signed   By: Randa Ngo M.D.   On: 02/05/2022 18:13   ECHOCARDIOGRAM COMPLETE  Result Date: 02/06/2022    ECHOCARDIOGRAM REPORT   Patient Name:   Austin Fleming Date of Exam: 02/06/2022 Medical Rec #:  [id number]     Height:       67.0 in Accession #:    [id number]    Weight:       142.4 lb Date of Birth:  11/02/1925     BSA:          1.750 m Patient Age:    89 years      BP:           135/111 mmHg Patient Gender: M             HR:           95 bpm. Exam Location:  [facility] Procedure: 2D Echo, Color Doppler and Cardiac Doppler Indications:     Elevated troponin  History:         Patient has prior history of Echocardiogram examinations.                  Stroke; Arrythmias:AV block. Altered mental status.  Sonographer:      Charmayne Sheer Referring Phys:  [id number] [place] Diagnosing Phys: [place]  1. Left ventricular ejection fraction, by estimation, is 30 to 35%. The left ventricle has severely decreased function. The left ventricle demonstrates global hypokinesis. The left ventricular internal cavity size was severely dilated. There is mild concentric left ventricular hypertrophy. Left ventricular diastolic parameters are consistent with Grade III diastolic dysfunction (restrictive).  2. Right ventricular systolic function is moderately reduced. The  right ventricular size is severely enlarged.  3. Left atrial size was severely dilated.  4. Right atrial size was severely dilated.  5. The mitral valve is normal in structure. Mild mitral valve regurgitation. No evidence of mitral stenosis.  6. Tricuspid valve regurgitation is moderate.  7. The aortic valve is normal in structure. Aortic valve regurgitation is mild. No aortic stenosis is present.  8. The inferior vena cava is normal in size with greater than 50% respiratory variability, suggesting right atrial pressure of 3 mmHg. FINDINGS  Left Ventricle: Left ventricular ejection fraction, by estimation, is 30 to 35%. The left ventricle has severely decreased function. The left ventricle demonstrates global hypokinesis. The left ventricular internal cavity size was severely dilated. There is mild concentric left ventricular hypertrophy. Left ventricular diastolic parameters are consistent with Grade III diastolic dysfunction (restrictive). Right Ventricle: The right ventricular size is severely enlarged. No increase in right ventricular wall thickness. Right ventricular systolic function is moderately reduced. Left Atrium: Left atrial size was severely dilated. Right Atrium: Right atrial size was severely dilated. Pericardium: There is no evidence of pericardial effusion. Mitral Valve: The mitral valve is normal in structure. Mild mitral valve regurgitation. No  evidence of mitral valve stenosis. MV peak gradient, 6.8 mmHg. The mean mitral valve gradient is 3.0 mmHg. Tricuspid Valve: The tricuspid valve is normal in structure. Tricuspid valve regurgitation is moderate . No evidence of tricuspid stenosis. Aortic Valve: The aortic valve is normal in structure. Aortic valve regurgitation is mild. No aortic stenosis is present. Aortic valve mean gradient measures 3.0 mmHg. Aortic valve peak gradient measures 5.7 mmHg. Aortic valve area, by VTI measures 1.24 cm. Pulmonic Valve: The pulmonic valve was normal in structure. Pulmonic valve regurgitation is mild. No evidence of pulmonic stenosis. Aorta: The aortic root is normal in size and structure. Venous: The inferior vena cava is normal in size with greater than 50% respiratory variability, suggesting right atrial pressure of 3 mmHg. IAS/Shunts: No atrial level shunt detected by color flow Doppler.  LEFT VENTRICLE PLAX 2D LVIDd:         5.14 cm   Diastology LVIDs:         4.30 cm   LV e' medial:    6.85 cm/s LV PW:         1.02 cm   LV E/e' medial:  15.7 LV IVS:        0.68 cm   LV e' lateral:   9.14 cm/s LVOT diam:     1.80 cm   LV E/e' lateral: 11.8 LV SV:         24 LV SV Index:   14 LVOT Area:     2.54 cm  RIGHT VENTRICLE RV Basal diam:  3.87 cm LEFT ATRIUM             Index        RIGHT ATRIUM           Index LA diam:        3.60 cm 2.06 cm/m   RA Area:     20.50 cm LA Vol (A2C):   56.3 ml 32.16 ml/m  RA Volume:   66.20 ml  37.82 ml/m LA Vol (A4C):   40.7 ml 23.25 ml/m LA Biplane Vol: 50.9 ml 29.08 ml/m  AORTIC VALVE                    PULMONIC VALVE AV Area (Vmax):    1.31 cm  PV Vmax:          0.75 m/s AV Area (Vmean):   1.11 cm     PV Vmean:         51.100 cm/s AV Area (VTI):     1.24 cm     PV VTI:           0.112 m AV Vmax:           119.00 cm/s  PV Peak grad:     2.2 mmHg AV Vmean:          88.100 cm/s  PV Mean grad:     1.0 mmHg AV VTI:            0.195 m      PR End Diast Vel: 21.53 msec AV Peak Grad:       5.7 mmHg AV Mean Grad:      3.0 mmHg LVOT Vmax:         61.40 cm/s LVOT Vmean:        38.600 cm/s LVOT VTI:          0.095 m LVOT/AV VTI ratio: 0.49  AORTA Ao Root diam: 3.90 cm MITRAL VALVE                TRICUSPID VALVE MV Area (PHT): 6.04 cm     TR Peak grad:   76.0 mmHg MV Area VTI:   1.89 cm     TR Vmax:        436.00 cm/s MV Peak grad:  6.8 mmHg MV Mean grad:  3.0 mmHg     SHUNTS MV Vmax:       1.30 m/s     Systemic VTI:  0.10 m MV Vmean:      85.2 cm/s    Systemic Diam: 1.80 cm MV Decel Time: 126 msec MV E velocity: 107.50 cm/s [employer] Electronically signed by Neoma Laming Signature Date/Time: 02/06/2022/12:07:07 PM    Final      TELEMETRY reviewed by me: Sinus rhythm with second-degree AV block, rate high 70s to low 100s overnight without evidence of long sinus pauses or sick sinus syndrome   ASSESSMENT AND PLAN:  Austin Fleming is a 30yoM with a PMH of dementia on aricept, h/o CVA suspicious for cerebral amyloid angiopathy, h/o intracranial bleed (taken off antiplts), 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  Cardiology is consulted because of his EKG showing a 2nd degree AV block.   #Second-degree AV block (Wenkebach) Present on EKG on admission, but appears similar to EKG from June 2022.  Telemetry is without long sinus pauses or evidence of sick sinus syndrome.  Heart rate has been mostly in the high 70s to low 100s throughout his hospitalization so far. -Okay to hold beta-blocker, other AV blocking agents -No indication for permanent pacemaker at this time -Continuous monitoring on telemetry  #New onset HFrEF (LVEF 30-35%, global hypo-, G3 DD) LVEF from 02/2021 was 60-65% with grade 1 diastolic dysfunction, unclear etiology of his reduced EF, per chart review has not been seen by cardiology  formally in outpatient setting, and with his history of bleeding, ongoing thrombocytopenia, advanced age, and current altered mental status he is not a good candidate for invasive cardiac evaluation.  He appears clinically volume overloaded with 2+ pitting edema bilaterally on  exam and BNP markedly elevated to 4400. -Agree with Lasix drip ordered by primary -Hold beta-blockers as above, defer ACE/ARB due to AKI -Consider addition of Isordil/hydralazine for further afterload reduction if his blood pressure tolerates -Repeat BMP daily -Strict I's/O -Appreciate palliative care input  #Elevated troponin Reportedly denies chest pain on admission.  High-sensitivity troponin elevated but flat trending [phone].  This is possibly demand ischemia in the setting of volume overload as above and conservative management from a cardiac standpoint -Defer antiplatelets and heparin drip due to history of intracranial bleed  #AKI on CKD 3 Currently creatinine 2.56, EGFR 23, slightly improving today with diuresis Baseline over the past 6 months his creatinine between 2.0-2.2, GFR between 34-38.  Cardiology signing off, please reengage if needed.  This patient's plan of care was discussed and created with Dr. Saralyn Pilar and he is in agreement.  Signed: Tristan Schroeder , PA-C 02/08/2022, 12:32 PM [facility] Cardiology

## 2022-02-09 DIAGNOSIS — E43 Unspecified severe protein-calorie malnutrition: Secondary | ICD-10-CM | POA: Diagnosis not present

## 2022-02-09 DIAGNOSIS — I5023 Acute on chronic systolic (congestive) heart failure: Secondary | ICD-10-CM | POA: Diagnosis not present

## 2022-02-09 DIAGNOSIS — N1832 Chronic kidney disease, stage 3b: Secondary | ICD-10-CM | POA: Diagnosis not present

## 2022-02-09 DIAGNOSIS — N170 Acute kidney failure with tubular necrosis: Secondary | ICD-10-CM | POA: Diagnosis not present

## 2022-02-09 LAB — BASIC METABOLIC PANEL
Anion gap: 10 (ref 5–15)
BUN: 54 mg/dL — ABNORMAL HIGH (ref 8–23)
CO2: 25 mmol/L (ref 22–32)
Calcium: 9.1 mg/dL (ref 8.9–10.3)
Chloride: 106 mmol/L (ref 98–111)
Creatinine, Ser: 2.83 mg/dL — ABNORMAL HIGH (ref 0.61–1.24)
GFR, Estimated: 20 mL/min — ABNORMAL LOW (ref >60)
Glucose, Bld: 90 mg/dL (ref 70–99)
Potassium: 3.3 mmol/L — ABNORMAL LOW (ref 3.5–5.1)
Sodium: 141 mmol/L (ref 135–145)

## 2022-02-09 LAB — MAGNESIUM: Magnesium: 2.5 mg/dL — ABNORMAL HIGH (ref 1.7–2.4)

## 2022-02-09 LAB — CORTISOL-AM, BLOOD: Cortisol - AM: 11 ug/dL (ref 6.7–22.6)

## 2022-02-09 MED ORDER — POTASSIUM CHLORIDE 10 MEQ/100ML IV SOLN
10.0000 meq | INTRAVENOUS | Status: AC
  Administered 2022-02-09 (×2): 10 meq via INTRAVENOUS
  Filled 2022-02-09 (×2): qty 100

## 2022-02-09 MED ORDER — ENSURE ENLIVE PO LIQD
237.0000 mL | Freq: Three times a day (TID) | ORAL | Status: DC
  Administered 2022-02-09 – 2022-02-11 (×4): 237 mL via ORAL

## 2022-02-09 MED ORDER — ENSURE ENLIVE PO LIQD: 237.0000 mL | Freq: Three times a day (TID) | ORAL | Status: DC

## 2022-02-09 NOTE — Progress Notes (Signed)
  Progress Note   Patient: Austin Fleming DOB: Aug 29, 1926 DOA: 02/05/2022     4 DOS: the patient was seen and examined on 02/09/2022   Brief hospital course: Austin Fleming is a 88 y.o. male with medical history significant for dementia on Aricept, CVA, reported history of intracranial bleed this year, taken off antiplatelets, Mobitz type I second-degree AV block, HFpEF 55 to 25%, grade 1 diastolic dysfunction, ambulatory dysfunction uses a walker at baseline, incontinent to stool and urine, CKD 3B who presented to [facility] ED with complaints of generalized weakness associated with altered mental status for the last 2 days.  Patient had elevated troponin at 472, BNP at 4451.  Chest x-ray showed vascular congestion consistent with CHF.  Started on Lasix drip.  Assessment and Plan: Acute on chronic systolic congestive heart failure. Elevated troponin secondary to congestive heart failure. Generalized weakness secondary to acute congestive heart failure. Second degree AV block with Wenckebach. Hypothermia. Volume status much better today, patient has been off oxygen.  I will hold off Lasix for today.  We will add oral Lasix tomorrow.   Chronic vitamin B12 deficient anemia. Mild thrombocytopenia. Stable   Vascular dementia. History of stroke. Followed by [facility] care.   Acute kidney injury on chronic kidney stage IIIb. Metabolic acidosis. Hyperkalemia. Renal function slightly worse today, hold off Lasix.  Recheck BMP tomorrow.         Subjective:  Patient is sleepy today, otherwise no significant short of breath.  Off oxygen.  Spoke with daughter, patient was able to eat.  Physical Exam: Vitals:   02/09/22 0830 02/09/22 1024 02/09/22 1208 02/09/22 1215  BP: 117/80  120/61   Pulse: 83  87 82  Resp: 18  19   Temp: (!) 97.5 F (36.4 C)  98.1 F (36.7 C)   TempSrc:      SpO2: 98% 100%  100%  Weight:      Height:       General exam: Ill-appearing, severely  malnourished. Respiratory system: Clear to auscultation. Respiratory effort normal. Cardiovascular system: S1 & S2 heard, RRR. No JVD, murmurs, rubs, gallops or clicks. No pedal edema. Gastrointestinal system: Abdomen is nondistended, soft and nontender. No organomegaly or masses felt. Normal bowel sounds heard. Central nervous system: Drowsy and oriented x1. No focal neurological deficits. Extremities: Symmetric 5 x 5 power. Skin: No rashes, lesions or ulcers   Data Reviewed:  Lab results reviewed.  Family Communication: Daughter updated at the bedside.  Disposition: Status is: Inpatient Remains inpatient appropriate because: Severity of disease, pending nursing home placement.  Planned Discharge Destination: Skilled nursing facility    Time spent: 35 minutes  Author: Sharen Hones, MD 02/09/2022 12:41 PM  For on call review [email or web address].

## 2022-02-09 NOTE — TOC Progression Note (Signed)
Transition of Care [facility]) - Progression Note    Patient Details  Name: Austin Fleming MRN: [id number] Date of Birth: June 16, 1926  [facility]) CM/SW Contact  Zigmund Daniel Dorian Pod, RN Phone Number:[phone] 02/09/2022, 11:43 AM  Clinical Narrative:    RN spoke with daughter [name] who accepted bed offer via Miqu[facility]). TOC RN left voice message with Spero Curb at the SNF accepting the bed that has been offered. No other needs or inquired from the family at this time.  TOC will continue to follow up accordingly.  Expected Discharge Plan: [place] Barriers to Discharge: Continued Medical Work up  Expected Discharge Plan and Services Expected Discharge Plan: [place] Acute Care Choice:  (TBD) Living arrangements for the past 2 months: Single Family Home                                       Social Determinants of Health (SDOH) Interventions    Readmission Risk Interventions     View : No data to display.

## 2022-02-09 NOTE — TOC Progression Note (Signed)
Transition of Care [facility]) - Progression Note    Patient Details  Name: Austin Fleming MRN: [id number] Date of Birth: 01-07-1926  Transition of Care [facility]) CM/SW Contact  Zigmund Daniel Dorian Pod, RN Phone Number:[phone] 02/09/2022, 10:23 AM  Clinical Narrative:    RN attempted outreach call to daughter Alvie Heidelberg [phone] however only able to leave a voice message requesting a call back. Will inquire further on choice for available bed offers.  TOC remains available to assist with recommended placement upon discharge.   Expected Discharge Plan: [place] Barriers to Discharge: Continued Medical Work up  Expected Discharge Plan and Services Expected Discharge Plan: [place] Acute Care Choice:  (TBD) Living arrangements for the past 2 months: Single Family Home                                       Social Determinants of Health (SDOH) Interventions    Readmission Risk Interventions     View : No data to display.

## 2022-02-10 DIAGNOSIS — N1832 Chronic kidney disease, stage 3b: Secondary | ICD-10-CM | POA: Diagnosis not present

## 2022-02-10 DIAGNOSIS — N170 Acute kidney failure with tubular necrosis: Secondary | ICD-10-CM | POA: Diagnosis not present

## 2022-02-10 DIAGNOSIS — J9601 Acute respiratory failure with hypoxia: Secondary | ICD-10-CM

## 2022-02-10 DIAGNOSIS — I5023 Acute on chronic systolic (congestive) heart failure: Secondary | ICD-10-CM | POA: Diagnosis not present

## 2022-02-10 LAB — CBC
HCT: 35.8 % — ABNORMAL LOW (ref 39.0–52.0)
Hemoglobin: 11.8 g/dL — ABNORMAL LOW (ref 13.0–17.0)
MCH: 28.4 pg (ref 26.0–34.0)
MCHC: 33 g/dL (ref 30.0–36.0)
MCV: 86.1 fL (ref 80.0–100.0)
Platelets: 107 10*3/uL — ABNORMAL LOW (ref 150–400)
RBC: 4.16 MIL/uL — ABNORMAL LOW (ref 4.22–5.81)
RDW: 15.4 % (ref 11.5–15.5)
WBC: 5.5 10*3/uL (ref 4.0–10.5)
nRBC: 0 % (ref 0.0–0.2)

## 2022-02-10 LAB — BASIC METABOLIC PANEL
Anion gap: 6 (ref 5–15)
BUN: 54 mg/dL — ABNORMAL HIGH (ref 8–23)
CO2: 29 mmol/L (ref 22–32)
Calcium: 8.6 mg/dL — ABNORMAL LOW (ref 8.9–10.3)
Chloride: 107 mmol/L (ref 98–111)
Creatinine, Ser: 2.56 mg/dL — ABNORMAL HIGH (ref 0.61–1.24)
GFR, Estimated: 23 mL/min — ABNORMAL LOW (ref >60)
Glucose, Bld: 92 mg/dL (ref 70–99)
Potassium: 3.5 mmol/L (ref 3.5–5.1)
Sodium: 142 mmol/L (ref 135–145)

## 2022-02-10 MED ORDER — TORSEMIDE 20 MG PO TABS
40.0000 mg | ORAL_TABLET | Freq: Two times a day (BID) | ORAL | Status: DC
  Administered 2022-02-10 – 2022-02-11 (×3): 40 mg via ORAL
  Filled 2022-02-10 (×3): qty 2

## 2022-02-10 MED ORDER — POTASSIUM CHLORIDE 20 MEQ PO PACK
40.0000 meq | PACK | Freq: Once | ORAL | Status: AC
  Administered 2022-02-10: 40 meq via ORAL
  Filled 2022-02-10: qty 2

## 2022-02-10 NOTE — Progress Notes (Signed)
  Progress Note   Patient: Austin Fleming [id number] DOB: June 14, 1926 DOA: 02/05/2022     5 DOS: the patient was seen and examined on 02/10/2022   Brief hospital course: Austin Fleming is a 88 y.o. male with medical history significant for dementia on Aricept, CVA, reported history of intracranial bleed this year, taken off antiplatelets, Mobitz type I second-degree AV block, HFpEF 55 to 09%, grade 1 diastolic dysfunction, ambulatory dysfunction uses a walker at baseline, incontinent to stool and urine, CKD 3B who presented to [facility] ED with complaints of generalized weakness associated with altered mental status for the last 2 days.  Patient had elevated troponin at 472, BNP at 4451.  Chest x-ray showed vascular congestion consistent with CHF.  Started on Lasix drip.  Assessment and Plan: Acute on chronic systolic congestive heart failure. Elevated troponin secondary to congestive heart failure. Generalized weakness secondary to acute congestive heart failure. Second degree AV block with Wenckebach. Hypothermia. Condition had improved, patient had a diuretic holiday yesterday, renal function is better today, restart oral torsemide.   Chronic vitamin B12 deficient anemia. Mild thrombocytopenia. Hemoglobin stable.   Vascular dementia. History of stroke. Followed by [facility] care.   Acute kidney injury on chronic kidney stage IIIb. Metabolic acidosis. Hyperkalemia. Hypokalemia. Patient has hypokalemia after initial hyperkalemia. Potassium still relatively low at 3.5, given continued diuretics.  I will give another 40 mEq potassium orally. Discontinue sodium bicarbonate as metabolic acidosis has resolved.           Subjective:  Patient has baseline confusion, otherwise condition had improved. Slept much better after giving Seroquel and melatonin.  Physical Exam: Vitals:   02/10/22 0339 02/10/22 0500 02/10/22 0822 02/10/22 1157  BP: 106/74  (!) 120/95 113/76  Pulse: 79  77 84   Resp:   16 20  Temp: 97.6 F (36.4 C)  (!) 97.5 F (36.4 C) (!) 97.5 F (36.4 C)  TempSrc: Oral     SpO2: 100%  100% 100%  Weight:  58.4 kg    Height:       General exam: Appears calm and comfortable  Respiratory system: Decreased breathing sounds without crackles or wheezes. Respiratory effort normal. Cardiovascular system: S1 & S2 heard, RRR. No JVD, murmurs, rubs, gallops or clicks. No pedal edema. Gastrointestinal system: Abdomen is nondistended, soft and nontender. No organomegaly or masses felt. Normal bowel sounds heard. Central nervous system: Alert and oriented x1. No focal neurological deficits. Extremities: Symmetric 5 x 5 power. Skin: No rashes, lesions or ulcers   Data Reviewed:  Lab results reviewed  Family Communication: Daughter updated at the bedside.  Disposition: Status is: Inpatient Remains inpatient appropriate because: Unsafe discharge  Planned Discharge Destination: Skilled nursing facility    Time spent: 35 minutes  Author: Sharen Hones, MD 02/10/2022 12:13 PM  For on call review [email or web address].

## 2022-02-11 DIAGNOSIS — Z515 Encounter for palliative care: Secondary | ICD-10-CM | POA: Diagnosis not present

## 2022-02-11 DIAGNOSIS — F03C0 Unspecified dementia, severe, without behavioral disturbance, psychotic disturbance, mood disturbance, and anxiety: Secondary | ICD-10-CM | POA: Diagnosis not present

## 2022-02-11 DIAGNOSIS — N1832 Chronic kidney disease, stage 3b: Secondary | ICD-10-CM | POA: Diagnosis not present

## 2022-02-11 DIAGNOSIS — N170 Acute kidney failure with tubular necrosis: Secondary | ICD-10-CM | POA: Diagnosis not present

## 2022-02-11 DIAGNOSIS — Z7189 Other specified counseling: Secondary | ICD-10-CM | POA: Diagnosis not present

## 2022-02-11 DIAGNOSIS — I5023 Acute on chronic systolic (congestive) heart failure: Secondary | ICD-10-CM | POA: Diagnosis not present

## 2022-02-11 DIAGNOSIS — J9601 Acute respiratory failure with hypoxia: Secondary | ICD-10-CM | POA: Diagnosis not present

## 2022-02-11 LAB — BASIC METABOLIC PANEL
Anion gap: 7 (ref 5–15)
BUN: 53 mg/dL — ABNORMAL HIGH (ref 8–23)
CO2: 31 mmol/L (ref 22–32)
Calcium: 8.9 mg/dL (ref 8.9–10.3)
Chloride: 102 mmol/L (ref 98–111)
Creatinine, Ser: 2.47 mg/dL — ABNORMAL HIGH (ref 0.61–1.24)
GFR, Estimated: 24 mL/min — ABNORMAL LOW (ref >60)
Glucose, Bld: 90 mg/dL (ref 70–99)
Potassium: 3.5 mmol/L (ref 3.5–5.1)
Sodium: 140 mmol/L (ref 135–145)

## 2022-02-11 LAB — MAGNESIUM: Magnesium: 2.5 mg/dL — ABNORMAL HIGH (ref 1.7–2.4)

## 2022-02-11 MED ORDER — POTASSIUM CHLORIDE CRYS ER 10 MEQ PO TBCR: 10.0000 meq | EXTENDED_RELEASE_TABLET | Freq: Two times a day (BID) | ORAL | Status: AC

## 2022-02-11 MED ORDER — QUETIAPINE FUMARATE 25 MG PO TABS
25.0000 mg | ORAL_TABLET | Freq: Every day | ORAL | 0 refills | Status: AC
Start: 2022-02-11 — End: ?

## 2022-02-11 MED ORDER — POTASSIUM CHLORIDE 20 MEQ PO PACK: 40.0000 meq | PACK | Freq: Once | ORAL | Status: DC

## 2022-02-11 MED ORDER — TORSEMIDE 40 MG PO TABS: 40.0000 mg | ORAL_TABLET | Freq: Every day | ORAL | Status: AC

## 2022-02-11 MED ORDER — SENNA 8.6 MG PO TABS: 1.0000 | ORAL_TABLET | Freq: Every day | ORAL | Status: DC

## 2022-02-11 NOTE — Progress Notes (Addendum)
Daily Progress Note   Patient Name: Austin Fleming       Date: 02/11/2022 DOB: Apr 25, 1926  Age: 88 y.o. MRN#: [id number] Attending Physician: Sharen Hones, MD Primary Care Physician: Leonel Ramsay, MD Admit Date: 02/05/2022  Reason for Consultation/Follow-up: Establishing goals of care  Subjective: Patient awake and alert- daughter, [name] is at bedside. Nathanel is eating- when [name] asks him if he is finished he responds "yes". Does not respond to my questions.  Discussed patient's current status and possible trajectories. Discussed the difference between hospice and palliative services.  [name] tells me she and family have discussed and would like patient to discharge to SNF. She is agreeable to outpatient Palliative following patient at SNF.   Length of Stay: 6  Current Medications: Scheduled Meds:   atorvastatin  40 mg Oral Daily   Chlorhexidine Gluconate Cloth  6 each Topical Q0600   donepezil  5 mg Oral QHS   feeding supplement  237 mL Oral TID BM   mouth rinse  15 mL Mouth Rinse BID   melatonin  5 mg Oral QHS   mirtazapine  7.5 mg Oral QHS   QUEtiapine  25 mg Oral QHS   torsemide  40 mg Oral BID   cyanocobalamin  1,000 mcg Oral Daily    Continuous Infusions:    PRN Meds:   Physical Exam Constitutional:      General: He is not in acute distress.    Appearance: He is ill-appearing.     Comments: Frail, cachectic  Pulmonary:     Effort: Pulmonary effort is normal.  Skin:    General: Skin is warm and dry.  Neurological:     Mental Status: He is alert.     Comments: Does not answer orientation questions            Vital Signs: BP 120/80 (BP Location: Left Arm)   Pulse 69   Temp 98 F (36.7 C)   Resp 14   Ht 5\' 7"  (1.702 m)   Wt 57.4 kg   SpO2 100%   BMI 19.82 kg/m   SpO2: SpO2: 100 % O2 Device: O2 Device: Room Air O2 Flow Rate: O2 Flow Rate (L/min): 2 L/min  Intake/output summary:  Intake/Output Summary (Last 24 hours) at 02/11/2022 1054 Last data filed at 02/11/2022 0500 Gross per 24 hour  Intake --  Output 600 ml  Net -600 ml   LBM: Last BM Date : 02/08/22 Baseline Weight: Weight: 72.6 kg Most recent weight: Weight: 57.4 kg       Palliative Assessment/Data: PPS 30%    Flowsheet Rows    Flowsheet Row Most Recent Value  Intake Tab   Referral Department Hospitalist  Unit at Time of Referral Cardiac/Telemetry Unit  Palliative Care Primary Diagnosis Cardiac  Date Notified 02/07/22  Palliative Care Type New Palliative care  Reason for referral Clarify Goals of Care  Date of Admission 02/05/22  Date first seen by Palliative Care 02/07/22  # of days Palliative referral response time 0 Day(s)  # of days IP prior to Palliative referral 2  Clinical Assessment   Psychosocial & Spiritual Assessment   Palliative Care Outcomes  Patient Active Problem List   Diagnosis Date Noted   Acute hypoxemic respiratory failure ([place]) 02/10/2022   Protein-calorie malnutrition, severe 02/09/2022   Hypothermia 02/08/2022   Acute renal failure superimposed on stage 3b chronic kidney disease ([place]) 02/06/2022   Vascular dementia ([place]) 02/06/2022   History of stroke 02/06/2022   Acute on chronic systolic CHF (congestive heart failure) ([place]) 80/32/1224   Metabolic acidosis 82/50/0370   Thrombocytopenia ([place]) 02/06/2022   B12 deficiency anemia 02/06/2022   Generalized weakness 02/05/2022   Normochromic normocytic anemia 03/07/2021   Hyperglycemia 02/26/2021   Mobitz type 1 second degree AV block 02/26/2021   AMS (altered mental status) 02/19/2021   Syncopal episodes 02/19/2021   Dehydration 02/19/2021   CKD (chronic kidney disease), stage IV ([place]) 02/19/2021   Acute CVA (cerebrovascular accident) ([place]) 02/19/2021    Palliative Care Assessment &  Plan   HPI: 88 y.o. male  with past medical history of dementia on Aricept, CVA, reported history of intracranial bleed this year, taken off antiplatelets, Mobitz type I second-degree AV block, HFpEF 55 to 48%, grade 1 diastolic dysfunction, ambulatory dysfunction uses a walker at baseline, incontinent to stool and urine, and CKD 3B  admitted on 02/05/2022 with generalized weakness and altered mental status.  Found to have acute on chronic systolic heart failure.  Patient continues with significant volume overload and started on Lasix infusion.  Altered mental status also continues.  PMT consulted to discuss goals of care.  Assessment:  -Failure to thrive in the setting of dementia, heart failure  Recommendations/Plan: Continue current medical interventions without de-escalation Plan for discharge to SNF TOC order to refer patient for outpatient Palliative services at SNF Noted last BM 6/2- will start Senna 1 po nightly - he is high risk for constipation given immobility, decreased po intake, and dementia  Code Status: DNR  Care plan was discussed with , daughter Alvie [place]idelberg  Thank you for allowing the Palliative Medicine Team to assist in the care of this patient.  Mariana Kaufman, AGNP-C Palliative Medicine  Please call Palliative Medicine team phone with any questions [phone]. For individual providers please see AMION.

## 2022-02-11 NOTE — TOC Transition Note (Signed)
Transition of Care [facility]) - CM/SW Discharge Note   Patient Details  Name: Austin Fleming MRN: [id number] Date of Birth: [birth year]  Transition of Care [facility]) CM/SW Contact:  Laurena Slimmer, RN Phone Number: 02/11/2022, 11:55 AM   Clinical Narrative:     EMS arranged. TOC signing off.      Barriers to Discharge: Continued Medical Work up   Patient Goals and CMS Choice        Discharge Placement                       Discharge Plan and Services     Post Acute Care Choice:  (TBD)                               Social Determinants of Health (SDOH) Interventions     Readmission Risk Interventions     View : No data to display.

## 2022-02-11 NOTE — Care Management Important Message (Signed)
Important Message  Patient Details  Name: Austin Fleming MRN: [id number] Date of Birth: Feb 06, 1926   Medicare Important Message Given:  Yes     Dannette Barbara 02/11/2022, 12:07 PM

## 2022-02-11 NOTE — TOC Progression Note (Addendum)
Transition of Care [facility]) - Progression Note    Patient Details  Name: Austin Fleming MRN: [id number] Date of Birth: [birth year]  [facility]) CM/SW Contact  Laurena Slimmer, RN Phone Number: 02/11/2022, 11:53 AM  Clinical Narrative:    Facility contacted. Patient ccan be accepted today to [facility]. MD, Nurse and daughter notified.  Expected Discharge Plan: [place] Barriers to Discharge: Continued Medical Work up  Expected Discharge Plan and Services Expected Discharge Plan: [place] Acute Care Choice:  (TBD) Living arrangements for the past 2 months: Single Family Home Expected Discharge Date: 02/11/22                                     Social Determinants of Health (SDOH) Interventions    Readmission Risk Interventions     View : No data to display.

## 2022-02-11 NOTE — Progress Notes (Signed)
Patient discharged to SNF, report called to receiving nurse at this time , patient left the unit via EMS

## 2022-02-11 NOTE — Progress Notes (Signed)
Calorie Count Note  48 hour calorie count ordered.  Diet: dysphagia 3 diet with thin liquids Supplements: Ensure Enlive po BID, each supplement provides 350 kcal and 20 grams of protein.   6/2 Breakfast: 140 kcals, 6 grams protein Lunch: nothing documented Dinner: nothing documented Supplements: nothing documented  Total intake: 140 kcal (8% of minimum estimated needs)  6 grams protein (7% of minimum estimated needs)  6/3 Breakfast: 172 kcals, 16 grams protein Lunch: 0% documented Dinner: 100 kcals, 7 grams protein Supplements: 1 Ensure Enlive (350 kcals, 20 grams protein)  Total intake: 622 kcal (37% of minimum estimated needs)  43 grams protein (51% of minimum estimated needs)  Nutrition Dx: Severe Malnutrition related to chronic illness (dementia) as evidenced by severe fat depletion, severe muscle depletion; ongoing  Goal: Patient will meet greater than or equal to 90% of their needs; unmet  Intervention:   -Continue Ensure Enlive po BID, each supplement provides 350 kcal and 20 grams of protein.  -Continue MVI with minerals daily -D/c calorie count  Loistine Chance, RD, LDN, [place] Registered Dietitian II Certified Diabetes Care and Education Specialist Please refer to [facility] for RD and/or RD on-call/weekend/after hours pager

## 2022-02-11 NOTE — Discharge Summary (Signed)
Physician Discharge Summary   Patient: Austin Fleming MRN: [id number] DOB: [birth year]  Admit date:     02/05/2022  Discharge date: 02/11/22  Discharge Physician: Sharen Hones   PCP: Leonel Ramsay, MD   Recommendations at discharge:    Follow with PCP in 1 week. Refer to palliative care.  Discharge Diagnoses: Principal Problem:   Generalized weakness Active Problems:   Acute renal failure superimposed on stage 3b chronic kidney disease (HCC)   Vascular dementia (HCC)   History of stroke   Acute on chronic systolic CHF (congestive heart failure) (HCC)   Metabolic acidosis   Thrombocytopenia (HCC)   B12 deficiency anemia   Hypothermia   Protein-calorie malnutrition, severe   Acute hypoxemic respiratory failure ([place])  Resolved Problems:   * No resolved hospital problems. *  Hospital Course: Austin Fleming is a 88 y.o. male with medical history significant for dementia on Aricept, CVA, reported history of intracranial bleed this year, taken off antiplatelets, Mobitz type I second-degree AV block, HFpEF 55 to 47%, grade 1 diastolic dysfunction, ambulatory dysfunction uses a walker at baseline, incontinent to stool and urine, CKD 3B who presented to [facility] ED with complaints of generalized weakness associated with altered mental status for the last 2 days.  Patient had elevated troponin at 472, BNP at 4451.  Chest x-ray showed vascular congestion consistent with CHF.  Started on Lasix drip.  Assessment and Plan:  Acute on chronic systolic congestive heart failure. Elevated troponin secondary to congestive heart failure. Generalized weakness secondary to acute congestive heart failure. Second degree AV block with Wenckebach. Hypothermia. Patient condition has improved, continue oral torsemide with added potassium.  Follow-up with PCP in 1 week.  Patient is medically stable to be transferred to nursing home.   Chronic vitamin B12 deficient anemia. Mild  thrombocytopenia. Hemoglobin stable.   Vascular dementia. History of stroke. Followed by [facility] care.   Acute kidney injury on chronic kidney stage IIIb. Metabolic acidosis. Hyperkalemia. Hypokalemia. Patient has hypokalemia after initial hyperkalemia. Continue oral potassium with continue torsemide.  Pressure ulcer: POA, follow with RN Pressure Injury 02/05/22 Heel Bilateral Deep Tissue Pressure Injury - Purple or maroon localized area of discolored intact skin or blood-filled blister due to damage of underlying soft tissue from pressure and/or shear. (Active)  02/05/22 2336  Location: Heel  Location Orientation: Bilateral  Staging: Deep Tissue Pressure Injury - Purple or maroon localized area of discolored intact skin or blood-filled blister due to damage of underlying soft tissue from pressure and/or shear.  Wound Description (Comments):   Present on Admission: Yes          Consultants: Cardiology Procedures performed: None  Disposition: Skilled nursing facility Diet recommendation:  Discharge Diet Orders (From admission, onward)     Start     Ordered   02/11/22 0000  Diet - low sodium heart healthy        02/11/22 1141           Cardiac diet DISCHARGE MEDICATION: Allergies as of 02/11/2022   No Known Allergies      Medication List     STOP taking these medications    mirtazapine 7.5 MG tablet Commonly known as: REMERON       TAKE these medications    acetaminophen 500 MG tablet Commonly known as: TYLENOL Take 500 mg by mouth every 6 (six) hours as needed.   atorvastatin 40 MG tablet Commonly known as: LIPITOR Take 40 mg by mouth daily.   Cholecalciferol  25 MCG (1000 UT) capsule Take 1,000 Units by mouth daily.   cyanocobalamin 1000 MCG tablet Take 1,000 mcg by mouth daily.   donepezil 5 MG tablet Commonly known as: ARICEPT Take 5 mg by mouth at bedtime.   OSTEO BI-FLEX ADV TRIPLE ST PO Take 1 tablet by mouth daily.   potassium  chloride 10 MEQ tablet Commonly known as: KLOR-CON M Take 1 tablet (10 mEq total) by mouth 2 (two) times daily.   QUEtiapine 25 MG tablet Commonly known as: SEROQUEL Take 1 tablet (25 mg total) by mouth at bedtime.   Torsemide 40 MG Tabs Take 40 mg by mouth daily.               Discharge Care Instructions  (From admission, onward)           Start     Ordered   02/11/22 0000  Discharge wound care:       Comments: Follow with RN   02/11/22 1141            Follow-up Information     Leonel Ramsay, MD Follow up in 1 week(s).   Specialty: Infectious Diseases Contact information: [place] [address] [phone]                Discharge Exam: Austin Fleming Weights   02/09/22 0127 02/10/22 0500 02/11/22 0525  Weight: 62.2 kg 58.4 kg 57.4 kg   General exam: Appears calm and comfortable  Respiratory system: Decreased breath sounds. Respiratory effort normal. Cardiovascular system: S1 & S2 heard, RRR. No JVD, murmurs, rubs, gallops or clicks. No pedal edema. Gastrointestinal system: Abdomen is nondistended, soft and nontender. No organomegaly or masses felt. Normal bowel sounds heard. Central nervous system: Alert and oriented x1. No focal neurological deficits. Extremities: Symmetric 5 x 5 power. Skin: No rashes, lesions or ulcers    Condition at discharge: good  The results of significant diagnostics from this hospitalization (including imaging, microbiology, ancillary and laboratory) are listed below for reference.   Imaging Studies: CT Head Wo Contrast  Result Date: 02/05/2022 CLINICAL DATA:  Mental status change, unknown cause EXAM: CT HEAD WITHOUT CONTRAST TECHNIQUE: Contiguous axial images were obtained from the base of the skull through the vertex without intravenous contrast. RADIATION DOSE REDUCTION: This exam was performed according to the departmental dose-optimization program which includes automated exposure control,  adjustment of the mA and/or kV according to patient size and/or use of iterative reconstruction technique. COMPARISON:  02/19/2021 FINDINGS: Brain: There is no acute intracranial hemorrhage, mass effect, or edema. Gray-white differentiation is preserved. There is no extra-axial fluid collection. Confluent areas of hypoattenuation in the supratentorial white matter nonspecific but may reflect advanced chronic microvascular ischemic changes. Prominence of the ventricles and sulci reflects parenchymal volume loss. Findings are similar to the prior study. Vascular: There is atherosclerotic calcification at the skull base. Skull: Calvarium is unremarkable. Sinuses/Orbits: Chronically opacified right frontal sinus. Lobular patchy mucosal thickening. Orbits are unremarkable. Other: Mastoid air cells are clear. IMPRESSION: No acute intracranial abnormality. Advanced chronic microvascular ischemic changes. Electronically Signed   By: Macy Mis M.D.   On: 02/05/2022 14:32   US Venous Img Lower Bilateral (DVT)  Result Date: 02/05/2022 CLINICAL DATA:  Bilateral leg edema EXAM: BILATERAL LOWER EXTREMITY VENOUS DOPPLER ULTRASOUND TECHNIQUE: Gray-scale sonography with graded compression, as well as color Doppler and duplex ultrasound were performed to evaluate the lower extremity deep venous systems from the level of the common femoral vein and including  the common femoral, femoral, profunda femoral, popliteal and calf veins including the posterior tibial, peroneal and gastrocnemius veins when visible. The superficial great saphenous vein was also interrogated. Spectral Doppler was utilized to evaluate flow at rest and with distal augmentation maneuvers in the common femoral, femoral and popliteal veins. COMPARISON:  None Available. FINDINGS: RIGHT LOWER EXTREMITY Common Femoral Vein: No evidence of thrombus. Normal compressibility, respiratory phasicity and response to augmentation. Saphenofemoral Junction: No evidence  of thrombus. Normal compressibility and flow on color Doppler imaging. Profunda Femoral Vein: No evidence of thrombus. Normal compressibility and flow on color Doppler imaging. Femoral Vein: No evidence of thrombus. Normal compressibility, respiratory phasicity and response to augmentation. Popliteal Vein: No evidence of thrombus. Normal compressibility, respiratory phasicity and response to augmentation. Calf Veins: No evidence of thrombus. Normal compressibility and flow on color Doppler imaging. Superficial Great Saphenous Vein: No evidence of thrombus. Normal compressibility. Venous Reflux:  None. Other Findings:  None. LEFT LOWER EXTREMITY Common Femoral Vein: No evidence of thrombus. Normal compressibility, respiratory phasicity and response to augmentation. Saphenofemoral Junction: No evidence of thrombus. Normal compressibility and flow on color Doppler imaging. Profunda Femoral Vein: No evidence of thrombus. Normal compressibility and flow on color Doppler imaging. Femoral Vein: No evidence of thrombus. Normal compressibility, respiratory phasicity and response to augmentation. Popliteal Vein: No evidence of thrombus. Normal compressibility, respiratory phasicity and response to augmentation. Calf Veins: No evidence of thrombus. Normal compressibility and flow on color Doppler imaging. Superficial Great Saphenous Vein: No evidence of thrombus. Normal compressibility. Venous Reflux:  None. Other Findings:  None. IMPRESSION: No evidence of deep venous thrombosis in either lower extremity. Electronically Signed   By: Franchot Gallo M.D.   On: 02/05/2022 18:11   DG Chest Port 1 View  Result Date: 02/05/2022 CLINICAL DATA:  Altered level of consciousness EXAM: PORTABLE CHEST 1 VIEW COMPARISON:  02/19/2021 FINDINGS: 2 frontal views of the chest demonstrate an enlarged cardiac silhouette. Stable ectasia and atherosclerosis of the thoracic aorta. Mild increased central vascular congestion, with patchy bibasilar  airspace disease. Trace right pleural effusion. No pneumothorax. No acute bony abnormalities. IMPRESSION: 1. Findings consistent with mild congestive heart failure. Electronically Signed   By: Randa Ngo M.D.   On: 02/05/2022 18:13   ECHOCARDIOGRAM COMPLETE  Result Date: 02/06/2022    ECHOCARDIOGRAM REPORT   Patient Name:   Austin Fleming Date of Exam: 02/06/2022 Medical Rec #:  [id number]     Height:       67.0 in Accession #:    [id number]    Weight:       142.4 lb Date of Birth:  Sep 05, 1926     BSA:          1.750 m Patient Age:    65 years      BP:           135/111 mmHg Patient Gender: M             HR:           95 bpm. Exam Location:  [facility] Procedure: 2D Echo, Color Doppler and Cardiac Doppler Indications:     Elevated troponin  History:         Patient has prior history of Echocardiogram examinations.                  Stroke; Arrythmias:AV block. Altered mental status.  Sonographer:     Charmayne Sheer Referring Phys:  [id number] [place] Diagnosing Phys: [place]  1. Left ventricular ejection fraction, by estimation, is 30 to 35%. The left ventricle has severely decreased function. The left ventricle demonstrates global hypokinesis. The left ventricular internal cavity size was severely dilated. There is mild concentric left ventricular hypertrophy. Left ventricular diastolic parameters are consistent with Grade III diastolic dysfunction (restrictive).  2. Right ventricular systolic function is moderately reduced. The right ventricular size is severely enlarged.  3. Left atrial size was severely dilated.  4. Right atrial size was severely dilated.  5. The mitral valve is normal in structure. Mild mitral valve regurgitation. No evidence of mitral stenosis.  6. Tricuspid valve regurgitation is moderate.  7. The aortic valve is normal in structure. Aortic valve regurgitation is mild. No aortic stenosis is present.  8. The inferior vena cava is normal in size with greater than 50% respiratory  variability, suggesting right atrial pressure of 3 mmHg. FINDINGS  Left Ventricle: Left ventricular ejection fraction, by estimation, is 30 to 35%. The left ventricle has severely decreased function. The left ventricle demonstrates global hypokinesis. The left ventricular internal cavity size was severely dilated. There is mild concentric left ventricular hypertrophy. Left ventricular diastolic parameters are consistent with Grade III diastolic dysfunction (restrictive). Right Ventricle: The right ventricular size is severely enlarged. No increase in right ventricular wall thickness. Right ventricular systolic function is moderately reduced. Left Atrium: Left atrial size was severely dilated. Right Atrium: Right atrial size was severely dilated. Pericardium: There is no evidence of pericardial effusion. Mitral Valve: The mitral valve is normal in structure. Mild mitral valve regurgitation. No evidence of mitral valve stenosis. MV peak gradient, 6.8 mmHg. The mean mitral valve gradient is 3.0 mmHg. Tricuspid Valve: The tricuspid valve is normal in structure. Tricuspid valve regurgitation is moderate . No evidence of tricuspid stenosis. Aortic Valve: The aortic valve is normal in structure. Aortic valve regurgitation is mild. No aortic stenosis is present. Aortic valve mean gradient measures 3.0 mmHg. Aortic valve peak gradient measures 5.7 mmHg. Aortic valve area, by VTI measures 1.24 cm. Pulmonic Valve: The pulmonic valve was normal in structure. Pulmonic valve regurgitation is mild. No evidence of pulmonic stenosis. Aorta: The aortic root is normal in size and structure. Venous: The inferior vena cava is normal in size with greater than 50% respiratory variability, suggesting right atrial pressure of 3 mmHg. IAS/Shunts: No atrial level shunt detected by color flow Doppler.  LEFT VENTRICLE PLAX 2D LVIDd:         5.14 cm   Diastology LVIDs:         4.30 cm   LV e' medial:    6.85 cm/s LV PW:         1.02 cm   LV  E/e' medial:  15.7 LV IVS:        0.68 cm   LV e' lateral:   9.14 cm/s LVOT diam:     1.80 cm   LV E/e' lateral: 11.8 LV SV:         24 LV SV Index:   14 LVOT Area:     2.54 cm  RIGHT VENTRICLE RV Basal diam:  3.87 cm LEFT ATRIUM             Index        RIGHT ATRIUM           Index LA diam:        3.60 cm 2.06 cm/m   RA Area:     20.50 cm LA Vol (A2C):   56.3  ml 32.16 ml/m  RA Volume:   66.20 ml  37.82 ml/m LA Vol (A4C):   40.7 ml 23.25 ml/m LA Biplane Vol: 50.9 ml 29.08 ml/m  AORTIC VALVE                    PULMONIC VALVE AV Area (Vmax):    1.31 cm     PV Vmax:          0.75 m/s AV Area (Vmean):   1.11 cm     PV Vmean:         51.100 cm/s AV Area (VTI):     1.24 cm     PV VTI:           0.112 m AV Vmax:           119.00 cm/s  PV Peak grad:     2.2 mmHg AV Vmean:          88.100 cm/s  PV Mean grad:     1.0 mmHg AV VTI:            0.195 m      PR End Diast Vel: 21.53 msec AV Peak Grad:      5.7 mmHg AV Mean Grad:      3.0 mmHg LVOT Vmax:         61.40 cm/s LVOT Vmean:        38.600 cm/s LVOT VTI:          0.095 m LVOT/AV VTI ratio: 0.49  AORTA Ao Root diam: 3.90 cm MITRAL VALVE                TRICUSPID VALVE MV Area (PHT): 6.04 cm     TR Peak grad:   76.0 mmHg MV Area VTI:   1.89 cm     TR Vmax:        436.00 cm/s MV Peak grad:  6.8 mmHg MV Mean grad:  3.0 mmHg     SHUNTS MV Vmax:       1.30 m/s     Systemic VTI:  0.10 m MV Vmean:      85.2 cm/s    Systemic Diam: 1.80 cm MV Decel Time: 126 msec MV E velocity: 107.50 cm/s Neoma Laming Electronically signed by Neoma Laming Signature Date/Time: 02/06/2022/12:07:07 PM    Final     Microbiology: Results for orders placed or performed during the hospital encounter of 02/05/22  Urine Culture     Status: Abnormal   Collection Time: 02/05/22  8:20 PM   Specimen: Urine, Random  Result Value Ref Range Status   Specimen Description   Final    URINE, RANDOM Performed at [facility], [address] [place], [place] [address]    Special Requests    Final    NONE Performed at [facility], [address] [place], [place] [address]    Culture (A)  Final    <10,000 COLONIES/mL INSIGNIFICANT GROWTH Performed at [facility], [address] [place], [place] [address]    Report Status 02/06/2022 FINAL  Final  MRSA Next Gen by PCR, Nasal     Status: None   Collection Time: 02/05/22 10:33 PM   Specimen: Nasal Mucosa; Nasal Swab  Result Value Ref Range Status   MRSA by PCR Next Gen NOT DETECTED NOT DETECTED Final    Comment: (NOTE) The GeneXpert MRSA Assay (FDA approved for NASAL specimens only), is one component of a comprehensive MRSA colonization  surveillance program. It is not intended to diagnose MRSA infection nor to guide or monitor treatment for MRSA infections. Test performance is not FDA approved in patients less than 37 years old. Performed at [facility], [address] [place],  [address]     Labs: CBC: Recent Labs  Lab 02/05/22 1344 02/06/22 0312 02/10/22 0427  WBC 5.7 5.1 5.5  NEUTROABS 4.2 4.2  --   HGB 12.1* 11.4* 11.8*  HCT 38.6* 36.3* 35.8*  MCV 89.6 89.0 86.1  PLT 112* 95* 628*   Basic Metabolic Panel: Recent Labs  Lab 02/06/22 0312 02/07/22 0548 02/08/22 0617 02/09/22 0444 02/10/22 0427 02/11/22 0711  NA 140 140 143 141 142 140  K 5.1 4.5 3.9 3.3* 3.5 3.5  CL 113* 115* 113* 106 107 102  CO2 18* 15* 23 25 29 31   GLUCOSE 99 85 80 90 92 90  BUN 48* 53* 55* 54* 54* 53*  CREATININE 2.56* 2.81* 2.69* 2.83* 2.56* 2.47*  CALCIUM 8.7* 8.9 8.9 9.1 8.6* 8.9  MG 2.5* 2.6* 2.5* 2.5*  --  2.5*  PHOS 4.7*  --   --   --   --   --    Liver Function Tests: Recent Labs  Lab 02/05/22 1344 02/06/22 0312  AST 29 31  ALT 42 41  ALKPHOS 104 95  BILITOT 1.6* 1.6*  PROT 7.1 6.3*  ALBUMIN 3.3* 2.8*   CBG: Recent Labs  Lab 02/05/22 2234 02/05/22 2235 02/05/22 2308 02/05/22 2315 02/06/22 0334  GLUCAP 23* 63* 35* 152* 108*    Discharge time spent: greater than  30 minutes.  Signed: Sharen Hones, MD [facility] 02/11/2022

## 2022-04-09 DEATH — deceased

## 2022-10-21 IMAGING — US US EXTREM LOW VENOUS
1 series · 13 of 24 positions shown · non-contrast
Comparison: None Available.

CLINICAL DATA: Bilateral leg edema



[Series 1: us venous img lower bilat (dvt) · portal-venous · 13 of 56 slices shown]
[im 1/56]
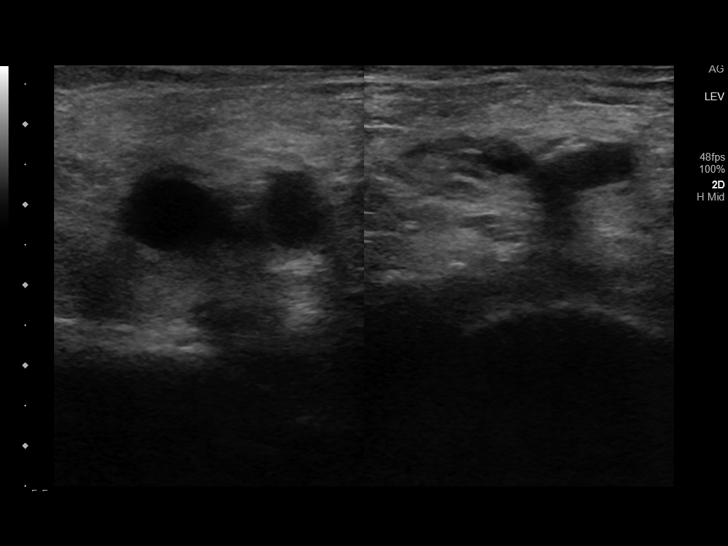
[im 5/56]
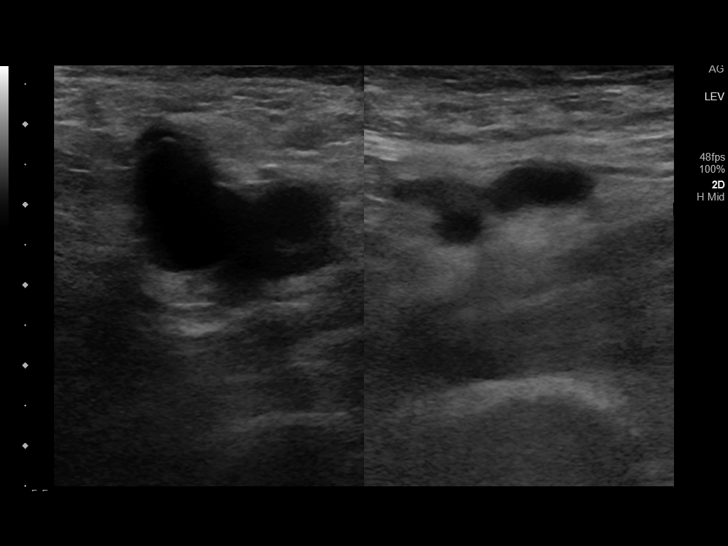
[im 10/56]
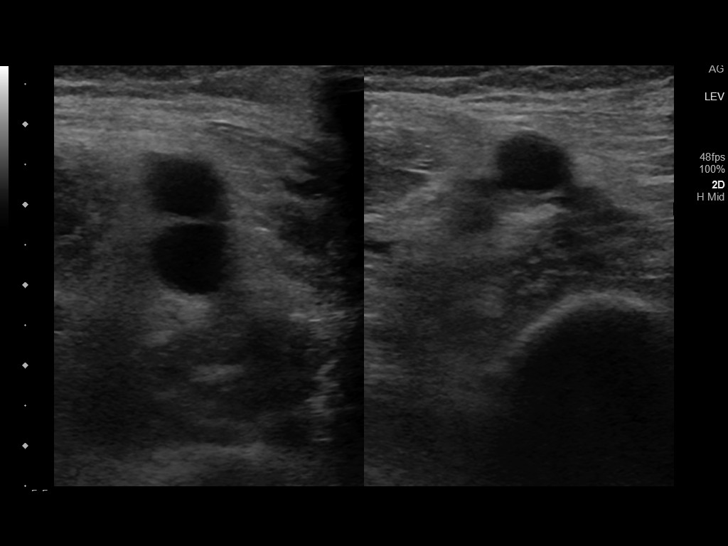
[im 15/56]
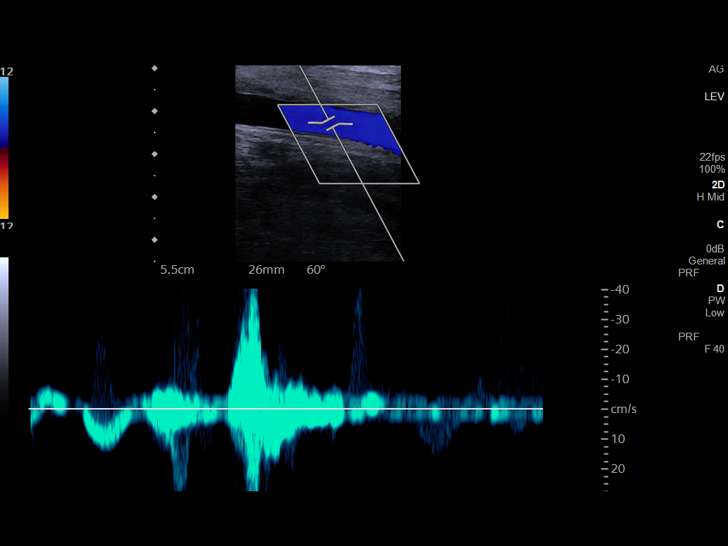
[im 20/56]
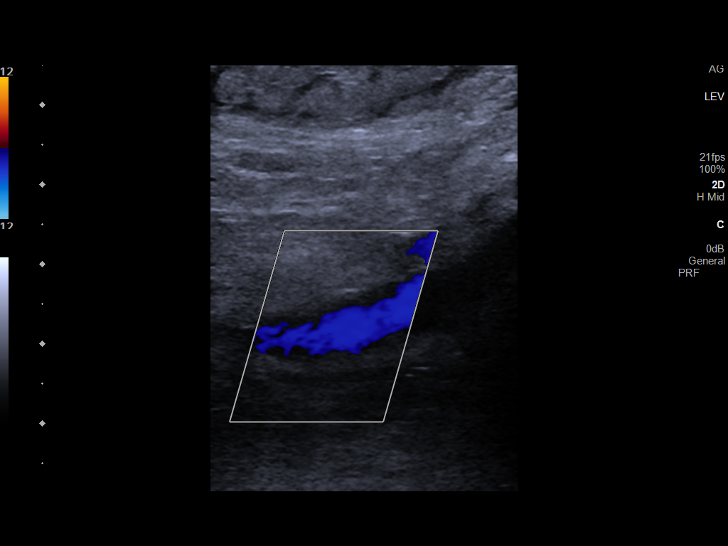
[im 24/56]
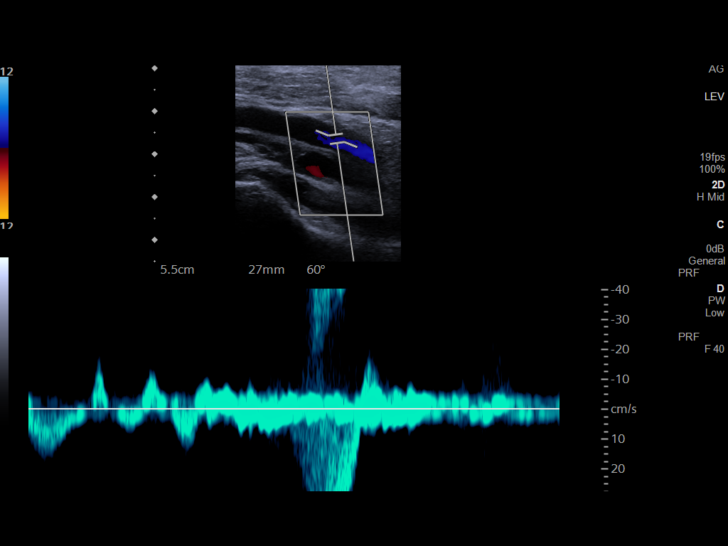
[im 29/56]
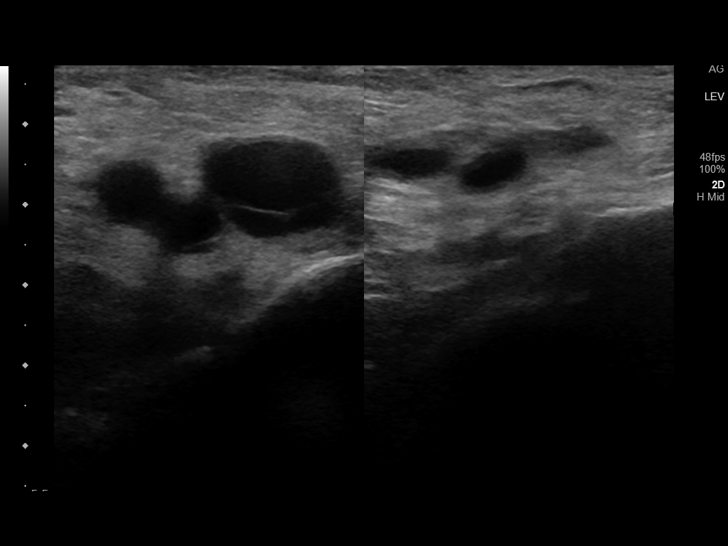
[im 32/56]
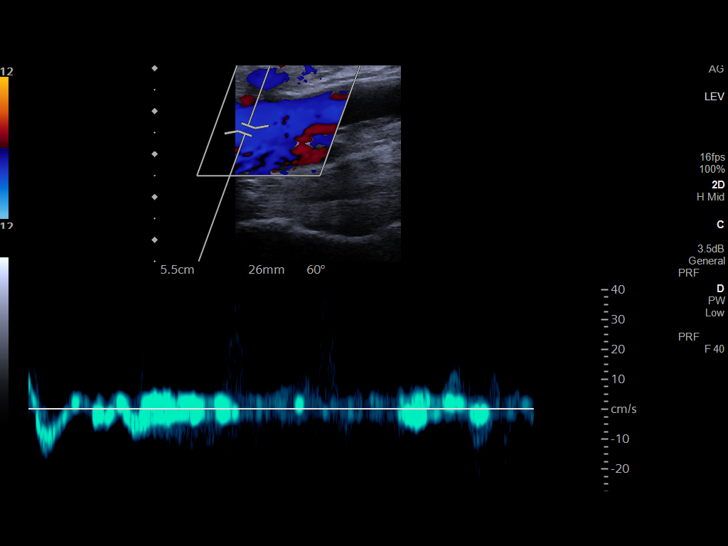
[im 36/56]
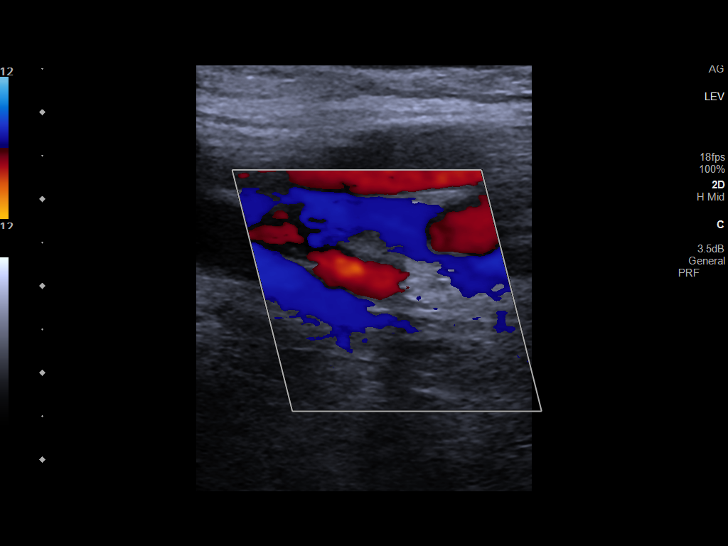
[im 41/56]
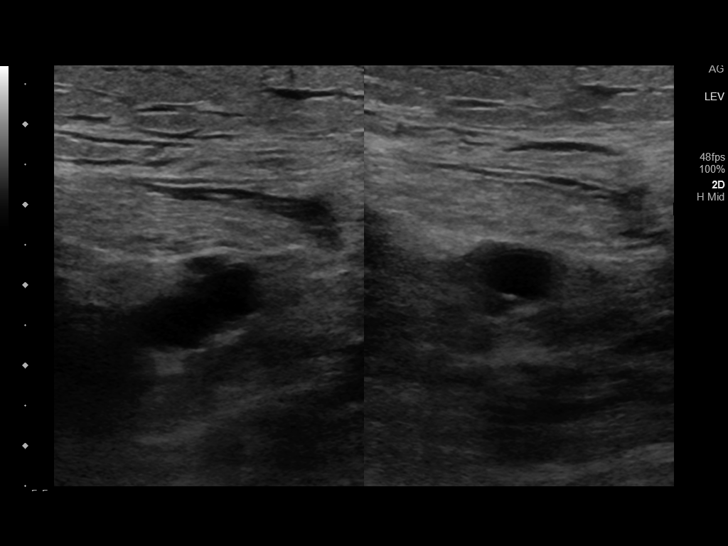
[im 46/56]
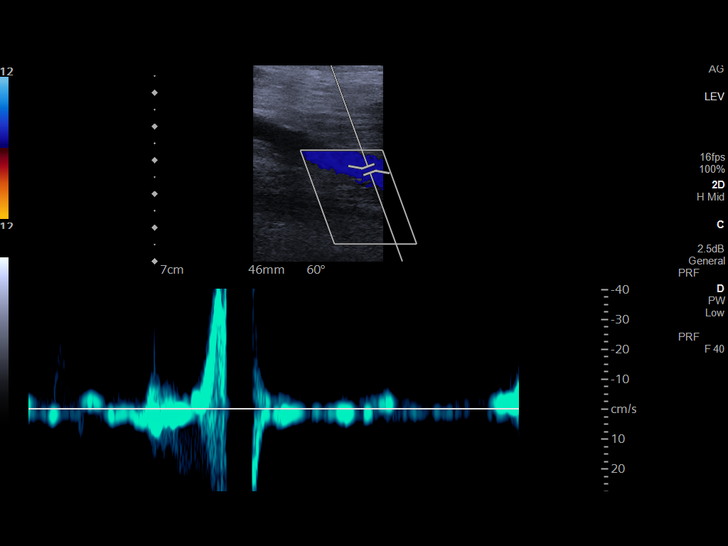
[im 51/56]
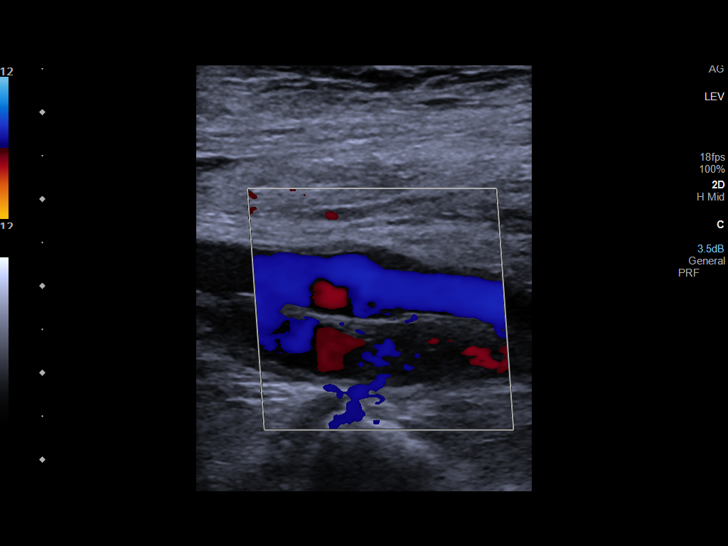
[im 56/56]
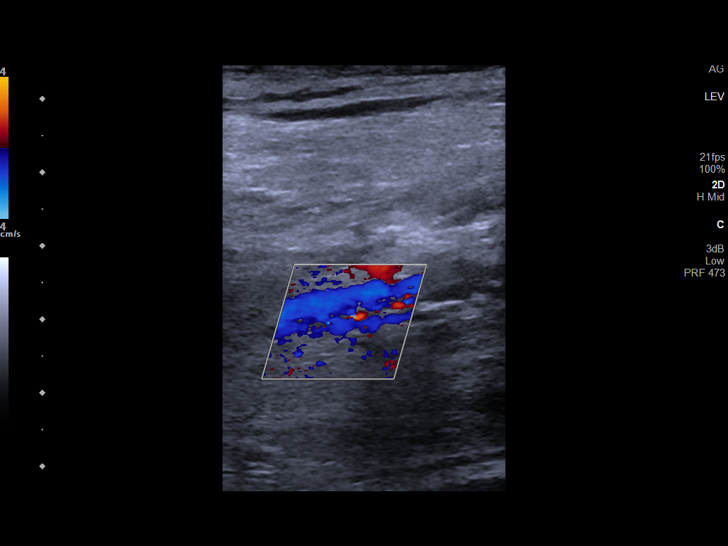

[13 of 24 positions shown; findings below may reference images not displayed]

FINDINGS: RIGHT LOWER EXTREMITY

Common Femoral Vein: No evidence of thrombus. Normal
compressibility, respiratory phasicity and response to augmentation.

Saphenofemoral Junction: No evidence of thrombus. Normal
compressibility and flow on color Doppler imaging.

Profunda Femoral Vein: No evidence of thrombus. Normal
compressibility and flow on color Doppler imaging.

Femoral Vein: No evidence of thrombus. Normal compressibility,
respiratory phasicity and response to augmentation.

Popliteal Vein: No evidence of thrombus. Normal compressibility,
respiratory phasicity and response to augmentation.

Calf Veins: No evidence of thrombus. Normal compressibility and flow
on color Doppler imaging.

Superficial Great Saphenous Vein: No evidence of thrombus. Normal
compressibility.

Venous Reflux:  None.

Other Findings:  None.

LEFT LOWER EXTREMITY

Common Femoral Vein: No evidence of thrombus. Normal
compressibility, respiratory phasicity and response to augmentation.

Saphenofemoral Junction: No evidence of thrombus. Normal
compressibility and flow on color Doppler imaging.

Profunda Femoral Vein: No evidence of thrombus. Normal
compressibility and flow on color Doppler imaging.

Femoral Vein: No evidence of thrombus. Normal compressibility,
respiratory phasicity and response to augmentation.

Popliteal Vein: No evidence of thrombus. Normal compressibility,
respiratory phasicity and response to augmentation.

Calf Veins: No evidence of thrombus. Normal compressibility and flow
on color Doppler imaging.

Superficial Great Saphenous Vein: No evidence of thrombus. Normal
compressibility.

Venous Reflux:  None.

Other Findings:  None.
IMPRESSION: No evidence of deep venous thrombosis in either lower extremity.

## 2022-10-21 IMAGING — CT CT HEAD W/O CM
4 series · 16 of 47 positions shown, 18 images · non-contrast
Comparison: 02/19/2021

CLINICAL DATA: Mental status change, unknown cause



[Series 2: head wo · axial · 0.43mm/px · z∈[-84,+36]mm · 7 of 34 slices shown, 9 images]
[im 5/34  brain]
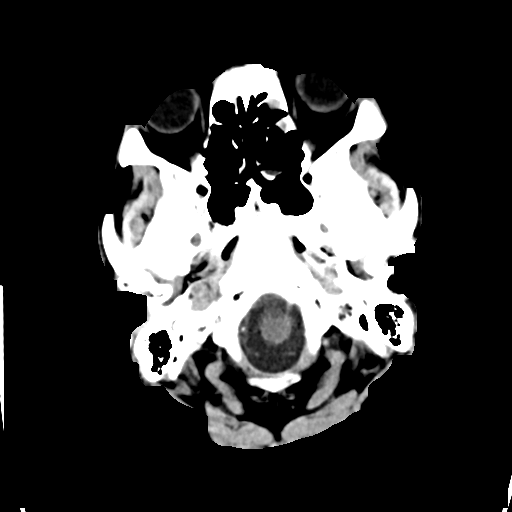
[im 5/34  bone]
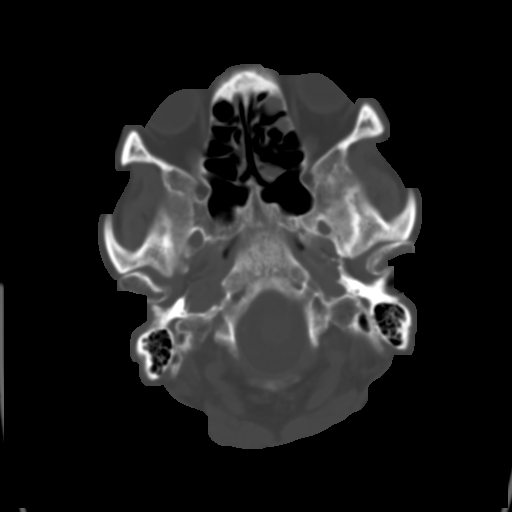
[im 9/34  brain]
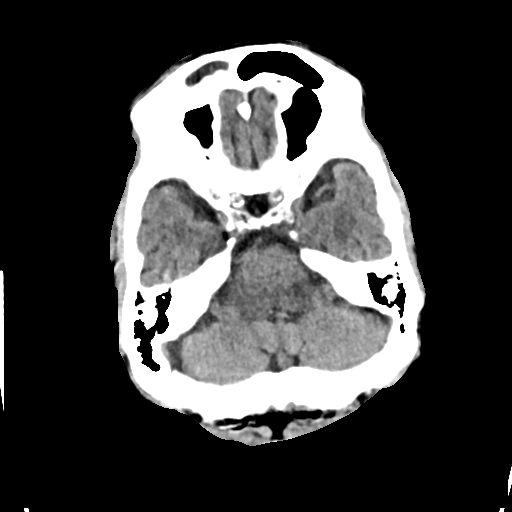
[im 13/34  brain]
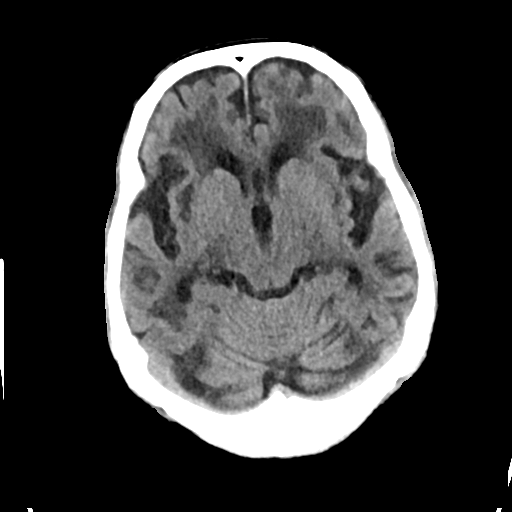
[im 17/34  brain]
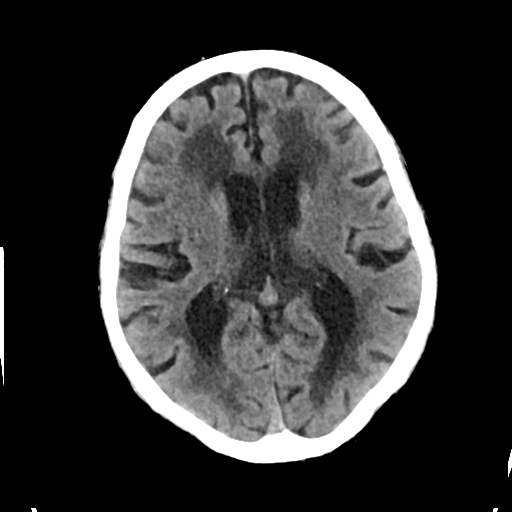
[im 21/34  brain]
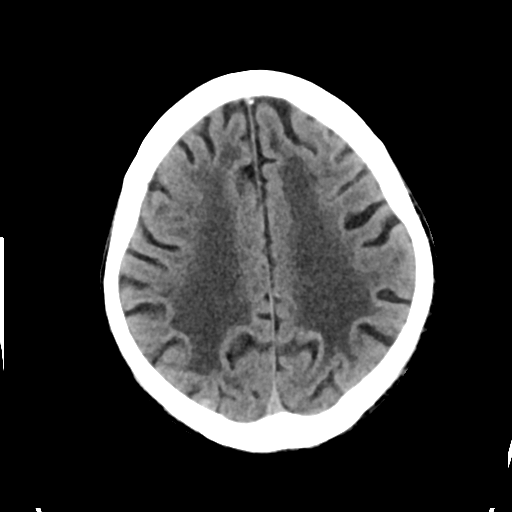
[im 21/34  bone]
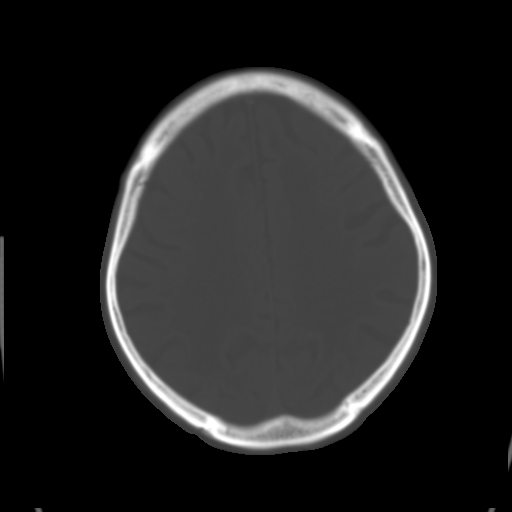
[im 25/34  brain]
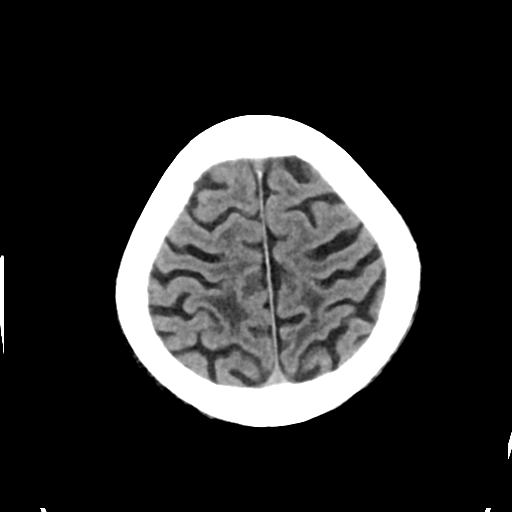
[im 29/34  brain]
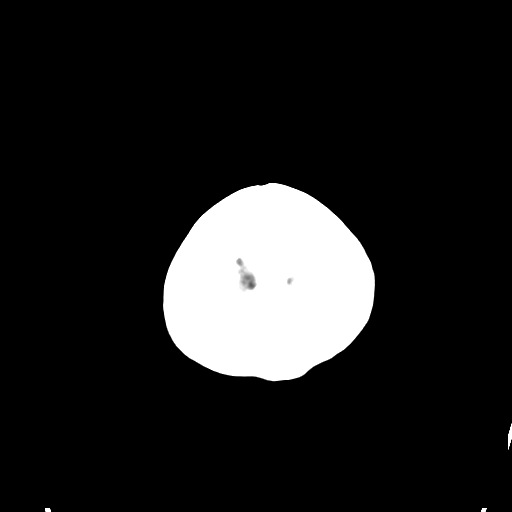

[Series 3: head bone · axial · 0.43mm/px · z∈[-88,-56]mm · 3 of 84 slices shown]
[im 9/84  bone]
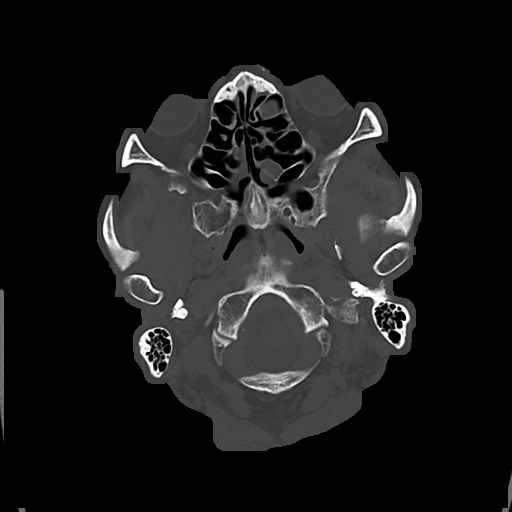
[im 17/84  bone]
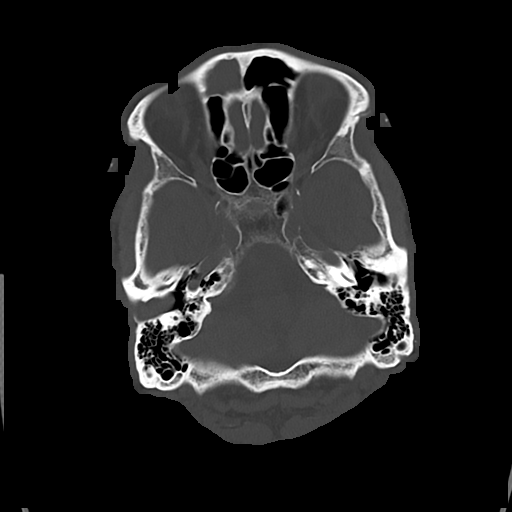
[im 25/84  bone]
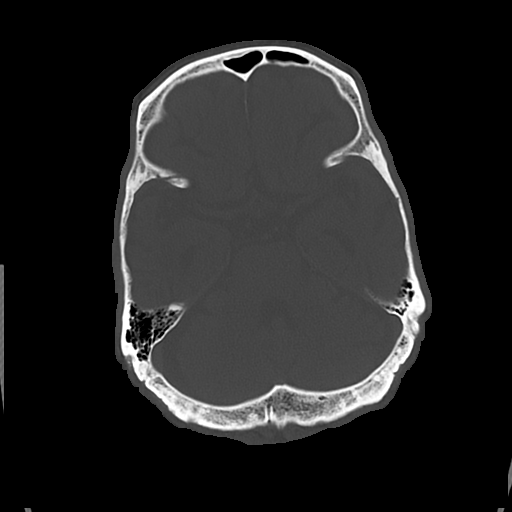

[Series 4: cor soft · coronal · 0.31mm/px · 3 of 64 slices shown]
[im 22/64  brain]
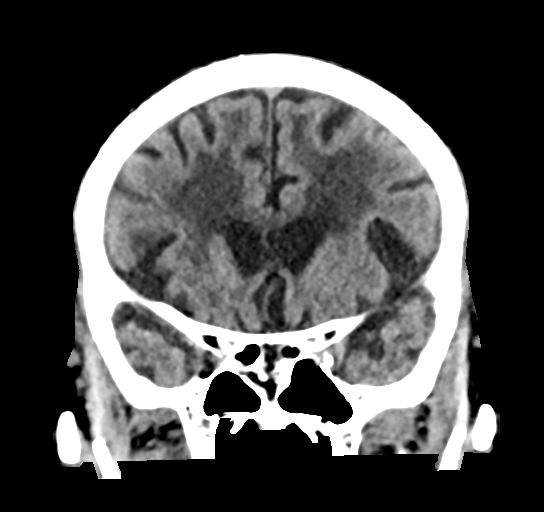
[im 29/64  brain]
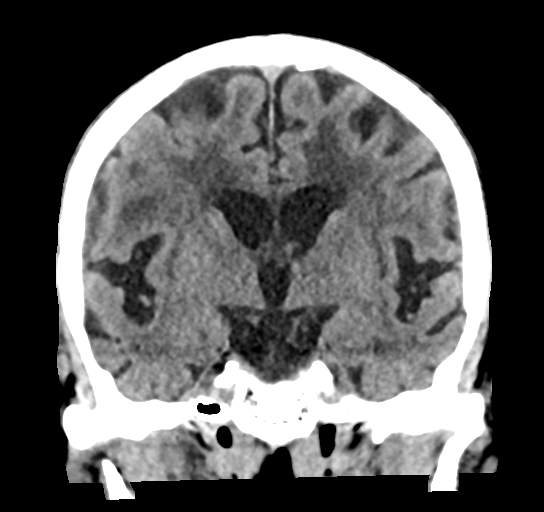
[im 36/64  brain]
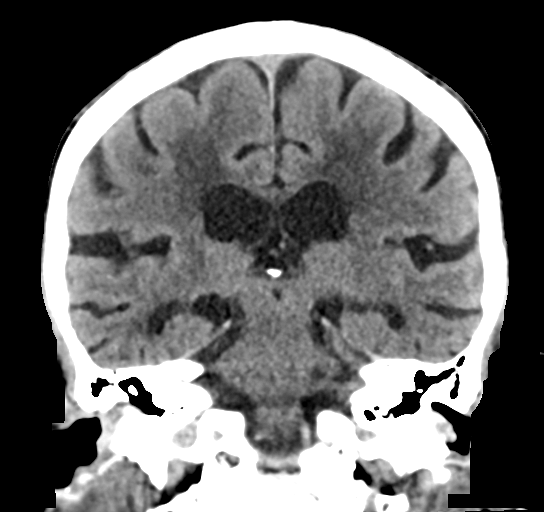

[Series 5: sag soft · sagittal · 0.31mm/px · 3 of 52 slices shown]
[im 18/52  brain]
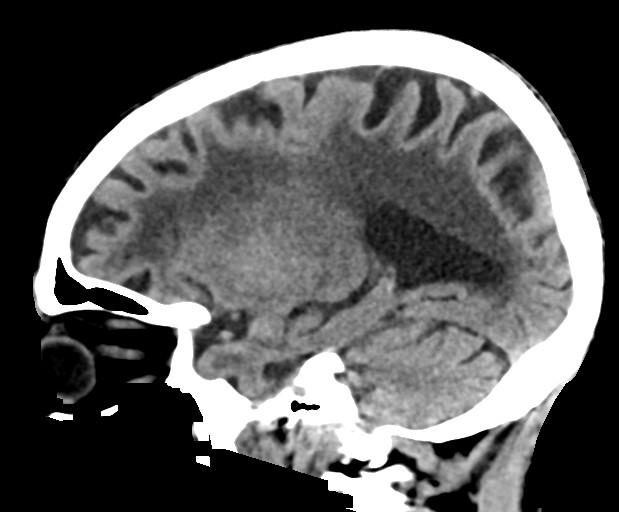
[im 26/52  brain]
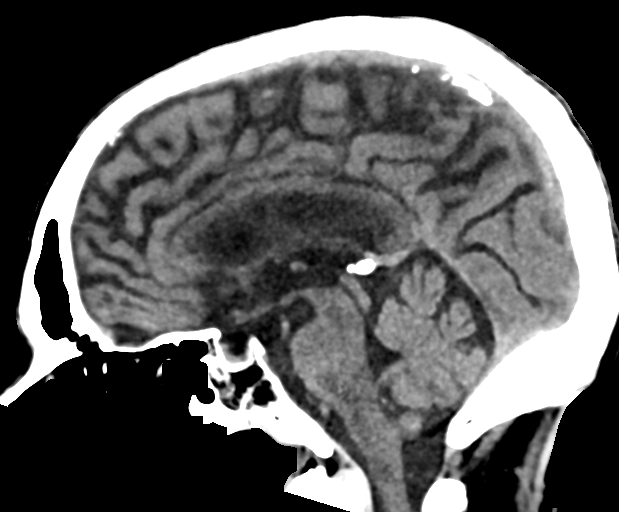
[im 35/52  brain]
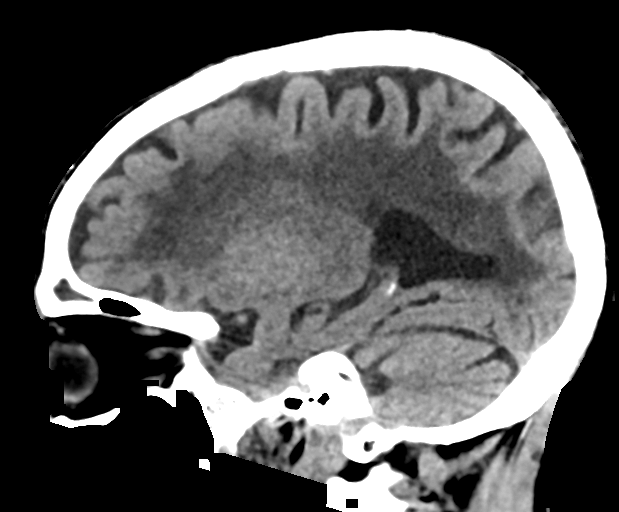

[16 of 47 positions shown; findings below may reference images not displayed]

FINDINGS: Brain: There is no acute intracranial hemorrhage, mass effect, or
edema. Gray-white differentiation is preserved. There is no
extra-axial fluid collection. Confluent areas of hypoattenuation in
the supratentorial white matter nonspecific but may reflect advanced
chronic microvascular ischemic changes. Prominence of the ventricles
and sulci reflects parenchymal volume loss. Findings are similar to
the prior study.

Vascular: There is atherosclerotic calcification at the skull base.

Skull: Calvarium is unremarkable.

Sinuses/Orbits: Chronically opacified right frontal sinus. Lobular
patchy mucosal thickening. Orbits are unremarkable.

Other: Mastoid air cells are clear.
IMPRESSION: No acute intracranial abnormality.

Advanced chronic microvascular ischemic changes.
